# Patient Record
Sex: Female | Born: 1940 | Race: White | Hispanic: No | Marital: Married | Smoking: Never smoker
Health system: Southern US, Community
[De-identification: ages and names within clinical notes are randomized; demographics above are authoritative.]

## PROBLEM LIST (undated history)

## (undated) DIAGNOSIS — E039 Hypothyroidism, unspecified: Secondary | ICD-10-CM

## (undated) DIAGNOSIS — E785 Hyperlipidemia, unspecified: Secondary | ICD-10-CM

## (undated) DIAGNOSIS — N390 Urinary tract infection, site not specified: Secondary | ICD-10-CM

## (undated) DIAGNOSIS — F0390 Unspecified dementia without behavioral disturbance: Secondary | ICD-10-CM

## (undated) HISTORY — PX: ABDOMINAL SURGERY: SHX537

---

## 2001-12-15 ENCOUNTER — Emergency Department (HOSPITAL_COMMUNITY): Admission: EM | Admit: 2001-12-15 | Discharge: 2001-12-15 | Payer: Self-pay | Admitting: Emergency Medicine

## 2001-12-15 ENCOUNTER — Encounter: Payer: Self-pay | Admitting: Emergency Medicine

## 2004-05-04 ENCOUNTER — Inpatient Hospital Stay (HOSPITAL_COMMUNITY): Admission: EM | Admit: 2004-05-04 | Discharge: 2004-05-10 | Payer: Self-pay | Admitting: Emergency Medicine

## 2004-10-28 ENCOUNTER — Emergency Department (HOSPITAL_COMMUNITY): Admission: EM | Admit: 2004-10-28 | Discharge: 2004-10-28 | Payer: Self-pay | Admitting: *Deleted

## 2004-11-21 ENCOUNTER — Ambulatory Visit: Payer: Self-pay | Admitting: Orthopedic Surgery

## 2005-02-19 ENCOUNTER — Emergency Department (HOSPITAL_COMMUNITY): Admission: EM | Admit: 2005-02-19 | Discharge: 2005-02-19 | Payer: Self-pay | Admitting: *Deleted

## 2005-05-14 ENCOUNTER — Emergency Department (HOSPITAL_COMMUNITY): Admission: EM | Admit: 2005-05-14 | Discharge: 2005-05-14 | Payer: Self-pay | Admitting: Emergency Medicine

## 2005-07-13 ENCOUNTER — Emergency Department (HOSPITAL_COMMUNITY): Admission: EM | Admit: 2005-07-13 | Discharge: 2005-07-14 | Payer: Self-pay | Admitting: Emergency Medicine

## 2005-07-20 ENCOUNTER — Ambulatory Visit (HOSPITAL_COMMUNITY): Admission: RE | Admit: 2005-07-20 | Discharge: 2005-07-20 | Payer: Self-pay | Admitting: Orthopaedic Surgery

## 2005-11-26 ENCOUNTER — Emergency Department (HOSPITAL_COMMUNITY): Admission: EM | Admit: 2005-11-26 | Discharge: 2005-11-27 | Payer: Self-pay | Admitting: Emergency Medicine

## 2005-11-29 ENCOUNTER — Inpatient Hospital Stay (HOSPITAL_COMMUNITY): Admission: EM | Admit: 2005-11-29 | Discharge: 2005-12-08 | Payer: Self-pay | Admitting: Emergency Medicine

## 2005-12-02 ENCOUNTER — Ambulatory Visit: Payer: Self-pay | Admitting: Internal Medicine

## 2005-12-13 ENCOUNTER — Ambulatory Visit (HOSPITAL_COMMUNITY): Admission: RE | Admit: 2005-12-13 | Discharge: 2005-12-13 | Payer: Self-pay | Admitting: Family Medicine

## 2005-12-13 ENCOUNTER — Inpatient Hospital Stay (HOSPITAL_COMMUNITY): Admission: AD | Admit: 2005-12-13 | Discharge: 2005-12-26 | Payer: Self-pay | Admitting: Family Medicine

## 2005-12-19 ENCOUNTER — Encounter (INDEPENDENT_AMBULATORY_CARE_PROVIDER_SITE_OTHER): Payer: Self-pay | Admitting: *Deleted

## 2006-03-07 ENCOUNTER — Emergency Department (HOSPITAL_COMMUNITY): Admission: EM | Admit: 2006-03-07 | Discharge: 2006-03-07 | Payer: Self-pay | Admitting: Emergency Medicine

## 2006-12-26 ENCOUNTER — Emergency Department (HOSPITAL_COMMUNITY): Admission: EM | Admit: 2006-12-26 | Discharge: 2006-12-26 | Payer: Self-pay | Admitting: Emergency Medicine

## 2006-12-28 ENCOUNTER — Emergency Department (HOSPITAL_COMMUNITY): Admission: EM | Admit: 2006-12-28 | Discharge: 2006-12-28 | Payer: Self-pay | Admitting: Emergency Medicine

## 2007-01-04 ENCOUNTER — Emergency Department (HOSPITAL_COMMUNITY): Admission: EM | Admit: 2007-01-04 | Discharge: 2007-01-04 | Payer: Self-pay | Admitting: Emergency Medicine

## 2007-01-20 ENCOUNTER — Emergency Department (HOSPITAL_COMMUNITY): Admission: EM | Admit: 2007-01-20 | Discharge: 2007-01-20 | Payer: Self-pay | Admitting: Emergency Medicine

## 2007-02-14 ENCOUNTER — Ambulatory Visit: Payer: Self-pay | Admitting: Gastroenterology

## 2007-03-01 ENCOUNTER — Ambulatory Visit (HOSPITAL_COMMUNITY): Admission: RE | Admit: 2007-03-01 | Discharge: 2007-03-01 | Payer: Self-pay | Admitting: Internal Medicine

## 2009-06-26 ENCOUNTER — Emergency Department (HOSPITAL_COMMUNITY): Admission: EM | Admit: 2009-06-26 | Discharge: 2009-06-26 | Payer: Self-pay | Admitting: Emergency Medicine

## 2009-07-02 ENCOUNTER — Observation Stay (HOSPITAL_COMMUNITY): Admission: RE | Admit: 2009-07-02 | Discharge: 2009-07-04 | Payer: Self-pay | Admitting: Orthopaedic Surgery

## 2009-08-21 ENCOUNTER — Emergency Department (HOSPITAL_COMMUNITY): Admission: EM | Admit: 2009-08-21 | Discharge: 2009-08-21 | Payer: Self-pay | Admitting: Emergency Medicine

## 2010-03-17 ENCOUNTER — Inpatient Hospital Stay (HOSPITAL_COMMUNITY): Admission: EM | Admit: 2010-03-17 | Discharge: 2010-03-19 | Payer: Self-pay | Admitting: Emergency Medicine

## 2010-08-14 ENCOUNTER — Encounter: Payer: Self-pay | Admitting: Family Medicine

## 2010-10-06 LAB — CBC
HCT: 36.3 % (ref 36.0–46.0)
Hemoglobin: 12.1 g/dL (ref 12.0–15.0)
MCV: 90.3 fL (ref 78.0–100.0)
RBC: 4.03 MIL/uL (ref 3.87–5.11)
WBC: 10.5 10*3/uL (ref 4.0–10.5)

## 2010-10-06 LAB — BASIC METABOLIC PANEL
CO2: 21 mEq/L (ref 19–32)
Calcium: 8.2 mg/dL — ABNORMAL LOW (ref 8.4–10.5)
Chloride: 105 mEq/L (ref 96–112)
Chloride: 115 mEq/L — ABNORMAL HIGH (ref 96–112)
Creatinine, Ser: 1.54 mg/dL — ABNORMAL HIGH (ref 0.4–1.2)
GFR calc non Af Amer: 33 mL/min — ABNORMAL LOW (ref >60)
GFR calc non Af Amer: 55 mL/min — ABNORMAL LOW (ref >60)
Glucose, Bld: 95 mg/dL (ref 70–99)
Potassium: 3.8 mEq/L (ref 3.5–5.1)
Potassium: 4.2 mEq/L (ref 3.5–5.1)
Sodium: 134 mEq/L — ABNORMAL LOW (ref 135–145)
Sodium: 134 mEq/L — ABNORMAL LOW (ref 135–145)

## 2010-10-06 LAB — CARDIAC PANEL(CRET KIN+CKTOT+MB+TROPI)
CK, MB: 1.9 ng/mL (ref 0.3–4.0)
CK, MB: 2.8 ng/mL (ref 0.3–4.0)

## 2010-10-06 LAB — DIFFERENTIAL
Eosinophils Relative: 3 % (ref 0–5)
Lymphocytes Relative: 12 % (ref 12–46)
Lymphs Abs: 1.2 10*3/uL (ref 0.7–4.0)
Monocytes Absolute: 0.9 10*3/uL (ref 0.1–1.0)

## 2010-10-06 LAB — URINALYSIS, ROUTINE W REFLEX MICROSCOPIC
Bilirubin Urine: NEGATIVE
Glucose, UA: NEGATIVE mg/dL
Ketones, ur: NEGATIVE mg/dL
pH: 5 (ref 5.0–8.0)

## 2010-10-06 LAB — POCT CARDIAC MARKERS
CKMB, poc: 1.4 ng/mL (ref 1.0–8.0)
Troponin i, poc: <0.05 ng/mL (ref 0.00–0.09)

## 2010-10-06 LAB — GLUCOSE, CAPILLARY: Glucose-Capillary: 113 mg/dL — ABNORMAL HIGH (ref 70–99)

## 2010-10-06 LAB — T4, FREE: Free T4: 1.11 ng/dL (ref 0.80–1.80)

## 2010-10-25 LAB — COMPREHENSIVE METABOLIC PANEL
ALT: 25 U/L (ref 0–35)
Alkaline Phosphatase: 85 U/L (ref 39–117)
BUN: 25 mg/dL — ABNORMAL HIGH (ref 6–23)
CO2: 24 mEq/L (ref 19–32)
Chloride: 107 mEq/L (ref 96–112)
Glucose, Bld: 91 mg/dL (ref 70–99)
Potassium: 3.8 mEq/L (ref 3.5–5.1)
Sodium: 140 mEq/L (ref 135–145)
Total Bilirubin: 0.6 mg/dL (ref 0.3–1.2)

## 2010-10-25 LAB — DIFFERENTIAL
Basophils Relative: 0 % (ref 0–1)
Eosinophils Absolute: 0.2 10*3/uL (ref 0.0–0.7)
Eosinophils Relative: 3 % (ref 0–5)
Lymphs Abs: 1 10*3/uL (ref 0.7–4.0)
Neutrophils Relative %: 72 % (ref 43–77)

## 2010-10-25 LAB — CBC
Hemoglobin: 11.3 g/dL — ABNORMAL LOW (ref 12.0–15.0)
MCHC: 33.6 g/dL (ref 30.0–36.0)
RBC: 3.58 MIL/uL — ABNORMAL LOW (ref 3.87–5.11)
WBC: 6.2 10*3/uL (ref 4.0–10.5)

## 2010-12-06 NOTE — Assessment & Plan Note (Signed)
NAME:  [name], [name]                   CHART#:  [id number]   DATE:  02/14/2007                       DOB:  [birth year]   CHIEF COMPLAINT:  Abdominal pain.   SUBJECTIVE:  [name] is here for an office visit today.  Her husband  came by and made the appointment for complaints of a growth in her  stomach.  She states today she has been having some abdominal pain.  We  last saw her during a hospitalization in May 2007.  She states she has  been having upper abdominal pain intermittent in nature for about 1  year.  She has had no nausea or vomiting or weight loss.  In June, she  had significant diarrhea and was seen in the emergency department and  she states that it is now resolved.  She states her bowel movements are  back to normal.  She denies any melena, rectal bleeding, vaginal  bleeding, dysuria, hematuria.  She states she was told she had a growth  in her stomach when she went to the emergency department on June 29th  for abdominal pain.  She is a very poor historian and does not know many  details of her medical history.  She did not recall that she had her  gallbladder removed or EGD and colonoscopy in May 2007, for instance.   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  The IMA artery opacifies but the origin is  not well seen.  She had a 3 x 3.6-cm calcified mass in the posterior  right hemipelvis, unchanged from May 2007 study.  This was contiguous  with the cranial margin of the sigmoid colon.  Labs at that time  revealed a normal BUN and creatinine.  White count of 6600, hemoglobin  13.4, platelets 342,000.   The patient was seen by our practice at the time she had abnormal  appearing sigmoid colon, although they  did not quote a mass at the  recent CT, which prompted a colonoscopy.  She also had a EGD for  abdominal pain.  On Dec 17, 2005, EGD revealed noncritical distal  esophageal ring, dilated with a 43 and a 56 French Maloney dilator,  small hiatal hernia, antral gastritis with three scars, and deformed but  patent pylorus indicative of prior peptic ulcer disease.  On  colonoscopy, she had a few diverticula at the sigmoid colon but  otherwise normal.  H. pylori serologies were positive and this was  deferred to primary M.D. for treatment.  The patient is not aware of  whether or not she actually was treated, however.   CURRENT MEDICATIONS:  Unknown.  The patient cannot recall, just states  that she is on a pill for her nerves possibly amitriptyline based on  recent ED record.  She also states that she is on Synthroid and a  hypertensive medication, possibly Benicar based on ED report.   ALLERGIES:  No known drug allergies.   PHYSICAL EXAMINATION:  VITAL SIGNS:  Weight 120.5, height 5 feet 4  inches, temp 97.8, blood pressure 180/100,  pulse 80.  GENERAL:  Elderly Caucasian female in no acute distress.  She is  accompanied by her husband.  She is somewhat slow in response and  appears to have difficulty recalling much of her medical history.  HEENT:  Sclerae nonicteric.  Oropharyngeal mucosa moist and pink.  Conjunctivae are pink.  CHEST:  Lungs are clear to auscultation.  CARDIAC:  Reveals a regular rate and rhythm.  A 2/6 systolic ejection  murmur.  ABDOMEN:  Positive bowel sounds.  Abdomen is soft.  Minimal epigastric  tenderness to deep palpation.  No organomegaly or masses.  No rebound  tenderness or guarding.  LOWER EXTREMITIES:  No edema.   IMPRESSION:  The patient is a 70 year old lady with a 1-year history of  upper abdominal pain.  It is intermittent in nature.  She is a very difficult historian and I cannot get an idea of whether  this occurs post prandial.  It is not  associated with any vomiting,  weight loss.  Recent diarrhea is now resolved.  CT reveals a stable  calcified mass in the posterior right hemipelvis.  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  This could explain her abdominal  pain and diarrhea.  At this point, we need to review the CT with the  radiologist.  We will make further recommendations.  In addition, we  will need to verify that her H. pylori has been treated and if we have  difficulty determining that, we will simply order a H. pylori stool  antigen to verify eradication.   PLAN:  1. Review CT.  2. Retrieve records from Dr. Renard [name]' office to determine whether H.      pylori has been treated.    ADDENDUM:  CT reviewed with Dr. Tyron Russell and Dr. Jena Gauss.  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  She has extensive  atherosclerotic disease of the aorta.  Iliac artery appears compromised  as well.  We will schedule her for angiography and possible stenting.  With regards to the Right hemipelvis mass, it is stable.  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  She will need follow-up with  gynecologist.       [name], P.A.  Electronically Signed     [name], M.D.  Electronically Signed    LL/MEDQ  D:  02/15/2007  T:  02/15/2007  Job:  [id number]   cc:   [name] G. Renard Matter, MD

## 2010-12-06 NOTE — Consult Note (Signed)
NAME:  [name], [name]                  ACCOUNT NO.:  [id number]   MEDICAL RECORD NO.:  [id number]          PATIENT TYPE:  AMB   LOCATION:  SDS                          FACILITY:  [facility]   PHYSICIAN:  Art A. Hoss, M.D.      DATE OF BIRTH:  June 21, 1941   DATE OF CONSULTATION:  DATE OF DISCHARGE:  03/01/2007                                 CONSULTATION   REASON FOR CONSULTATION:  Abdominal pain, SMA and celiac stenosis.   HISTORY OF PRESENT ILLNESS:  [name] has a 1-2 year history of  increasing abdominal pain, which is intermittent.  She denies any change  after eating and denies any weight loss.  She has had a number of  emergency room visits for abdominal pain and the etiology is not yet  clear.  She has had an endoscopy, which showed a noncritical distal  esophageal ring.  She also has sigmoid diverticula.  The distal  esophageal ring was dilated to 56 [place].  A CT scan did demonstrate  atherosclerotic disease of her celiac, SMA and IMA.  There is also  suspected to be a lesion in the proximal right common iliac artery.  She  denies any history of claudication.  She describes weakness in both of  her legs and does have great difficulty in ambulation, but this is  symmetrical bilaterally.  She has also had some emergency room visits  for left lower extremity pain.  She denies any nonhealing ulcers of her  toes, but does have a number of sores in her legs that she sees  dermatology for and is treating with hydrocortisone cream.  She denies  any prior surgical interventions for peripheral vascular disease,  coronary vascular disease, or cerebrovascular disease.   PAST MEDICAL HISTORY:  Anxiety disorder and hypertension.   MEDICATIONS:  1. Hydrochlorothiazide.  2. Synthroid.  3. Anxiety medication (she does not know the name of the drug).   ALLERGIES:  No known drug allergies.   SOCIAL HISTORY:  Negative tobacco and negative alcohol.   REVIEW OF SYMPTOMS:  No melena, hematochezia,  or nausea.  No  postprandial pain.  No lower extremity claudication.  No chest pain or  shortness of breath.   PHYSICAL EXAMINATION:  VITAL SIGNS:  She is afebrile.  Blood pressure  today is 195/110.  GENERAL:  In no apparent distress.  HEART:  Regular  rate and rhythm.  LUNGS:  Clear to auscultation.  ABDOMEN:  Soft, no  mass, positive bowel sounds.  NEUROLOGICAL:  Nonfocal.  EXTREMITIES:  There is a significant papular rash involving both legs, predominantly  at her shins.  This is a chronic finding according to her.  No sores or  areas of ulceration of her feet.  No edema or cyanosis.  Pulses in the  right lower extremity are 1+ for the femoral pulse, Dopplerable at the  dorsalis pedis and Dopplerable at the posterior tibial.  In the left  lower extremity, there is a 2+ left femoral pulse, 1+ left dorsalis  pedis pulse, and no pulse at the left posterior tibial artery.  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  She will  undergo mesenteric arteriography today for further evaluation.  Without  a history of postprandial pain or weight loss, I suspect that she does  not have a significant lesion causing her symptomatology.  In addition, there is a right common iliac artery lesion noted by CT.  She has no significant symptomatology related to this at this time that  should prompt treatment.  She does have difficulty with ambulation, but  denies any rest pain in her right leg, claudication in her right leg or  open sores.           ______________________________  Art A. Hoss, M.D.     AAH/MEDQ  D:  03/01/2007  T:  03/02/2007  Job:  [id number]   cc:   Angus G. Renard Matter, MD  R. Roetta Sessions, M.D.

## 2010-12-09 NOTE — Discharge Summary (Signed)
NAME:  [name], [name]                  ACCOUNT NO.:  [id number]   MEDICAL RECORD NO.:  [id number]          PATIENT TYPE:  INP   LOCATION:  A201                          FACILITY:  [facility]   PHYSICIAN:  Angus G. Renard Matter, MD   DATE OF BIRTH:  1941-05-09   DATE OF ADMISSION:  05/04/2004  DATE OF DISCHARGE:  10/18/2005LH                                 DISCHARGE SUMMARY   DIAGNOSES:  1.  Fracture of pubis.  2.  Rhabdomyolysis.  3.  Hyperosmolality.  4.  Hypothyroidism.  5.  Hypokalemia.  6.  Anemia.   CONDITION ON DISCHARGE:  Stable and improved at the time of discharge.   HISTORY OF PRESENT ILLNESS:  This 70 year old fell in her kitchen and was  down for four hours.  She was unable to get up.  The family found her and  brought her to the hospital.  She was noted to have a right superior ramus  fracture, elevated cardiac enzymes, and hypertension on admission.  She had  a slow halting type speech.   LABORATORY DATA:  Admission CBC:  WBC 12,600, hemoglobin 12.2, hematocrit  35.5.  D-dimer 2.54.  Chemistries on admission revealed a sodium of 129,  potassium 3.4, chloride 95, CO2 25, glucose 101, BUN 10, creatinine 0.9,  calcium 9.9.  Subsequent chemistries on May 09, 2004 revealed a sodium  of 126, potassium 3.3, chloride 86, CO2 34, glucose 85, BUN 4, creatinine 1.  Liver enzymes revealed an SGOT of 107, SGPT 47, alkaline phosphatase 103,  bilirubin 0.8, CK on admission 5376, with a CK-MB of 85.5, index 1.6,  troponin 0.32.  Urinalysis negative.   X-rays of the pelvis revealed a minimally displaced fracture of the right  superior pubic ramus.  CT of the brain without contrast negative for  hemorrhage or acute abnormality.  Right foot no acute abnormalities.  CT of  the chest with contrast revealed no pulmonary emboli, lungs clear.  Echocardiogram revealed small-to-moderate pericardial effusion, otherwise  essentially normal echocardiogram.  EKG revealed normal sinus rhythm,  diffuse  nonspecific T wave abnormalities.   HOSPITAL COURSE:  The patient, at the time of her admission, was placed at  bedrest.  She was placed on metoprolol 50 mg b.i.d., clonidine p.r.n. for  blood pressure control.  She was subsequently placed on a regular diet.  She  was given Vicodin p.r.n. for her pain.  The patient was kept at bedrest  initially for several days and was gradually ambulating.   She was seen in consultation by cardiology.  An echocardiogram was obtained.  Her Lopressor doses were decreased to 25 mg b.i.d. Physical therapy was  asked to help with ambulation.  The patient was seen in consultation by  [facility].  She was felt to have rhabdomyolysis due to prolonged  immobility.   The patient was also seen by Dr. Hilda Lias during her hospital stay, and he  felt that she should be seen later in the office as an outpatient.   It was felt that hypothyroidism accounted for many of the features of  presentation.  Her metoprolol was decreased.  The patient was able to be discharged after  six days of hospitalization.  She was ambulating.   DISCHARGE MEDICATIONS:  1.  Synthroid 0.25 mg daily.  2.  K-Dur 20 mEq daily.  3.  Vicodin 5/500 q.4h. p.r.n. for pain.  4.  Altace 10 mg daily.  5.  Hydrochlorothiazide 12.5 mg daily.  6.  Lopressor 25 mg b.i.d.     Angu   AGM/MEDQ  D:  05/24/2004  T:  05/24/2004  Job:  [id number]

## 2010-12-09 NOTE — Group Therapy Note (Signed)
NAME:  [name], [name]                  ACCOUNT NO.:  [id number]   MEDICAL RECORD NO.:  [id number]          PATIENT TYPE:  INP   LOCATION:  A201                          FACILITY:  [facility]   PHYSICIAN:  Catalina Pizza, M.D.        DATE OF BIRTH:  [birth year]   DATE OF PROCEDURE:  12/23/2005  DATE OF DISCHARGE:                                   PROGRESS NOTE   SUBJECTIVE:  Patient is postop day 4 from laparoscopic cholecystectomy.  She  denies any abdominal pain at this time.  She states that her eating is still  not great, but she is taking the Ensure which she is getting routinely.  She  was evaluated and seen by neurologist for her hyperextension of her neck and  her eye blinking which questioned whether related to tardive-type symptoms,  so some of her medicines were discontinued.   OBJECTIVE:  VITAL SIGNS:  Temperature is 98, blood pressure 160/92, pulse  80, respirations 20, 98% on room air.  GENERAL:  Generally, this is a thin white female, older than stated age.  HEENT:  Oropharynx is dry.  LUNGS:  Lungs have a rhonchi at the bases bilaterally.  HEART:  Regular rhythm.  No murmurs appreciated.  ABDOMEN:  Soft, nontender.  No signs of significant erythema around incision  site.  NECK:  Neck does have some generalized stiffness and hyperextension.  EXTREMITIES:  No lower extremity edema.  Patient is moving all extremities,  but does continue to have some clonus.   IMPRESSION:  This is a 70 year old white female who was admitted for  continued abdominal pain and acute renal failure which has resolved with  essentially anorexia.   ASSESSMENT AND PLAN:  1.  Status post cholecystectomy:  She appears to be doing well after this      and abdominal pain is much improved.  Continued to be followed by Dr.      Lovell Sheehan.  2.  Hypertension:  We will start her back on Benicar since her creatinine      has come back to baseline and since the patient is drinking more fluids.      She may need to be  back on her clonidine as well.  3.  Abnormal neck extension and clonus:  Seen and assessed by neurology and      felt that this could be related to tardive-type picture.  She did have      MRI which did not reveal any significant abnormality.  Also, a possible      cause were the Zofran and Darvocet.  4.  Anxiety/depression:  Continue the Lexapro as previously prescribed.  5.  Hypothyroidism:  She continues to have an elevated TSH and her dosage      was increased.  We will need to recheck this in approximately 3-4 weeks      to see if it needs to be titrated up further, and this also may be      contributing to some of her difficulties.  6.  Anorexia:  Trying to encourage the  patient as best I can to eat and      drink.  She is having Ensure, but we will need to continue with      nutrition and patient may be a candidate for having something like      Megace.      Catalina Pizza, M.D.  Electronically Signed     ZH/MEDQ  D:  12/23/2005  T:  12/23/2005  Job:  [id number]

## 2010-12-09 NOTE — Consult Note (Signed)
NAME:  [name], [name]                  ACCOUNT NO.:  [id number]   MEDICAL RECORD NO.:  [id number]          PATIENT TYPE:  INP   LOCATION:  A201                          FACILITY:  [facility]   PHYSICIAN:  Kofi A. Gerilyn Pilgrim, M.D. DATE OF BIRTH:  April 28, 1941   DATE OF CONSULTATION:  12/20/2005  DATE OF DISCHARGE:                                   CONSULTATION   REASON FOR CONSULTATION:  Abnormal posture.   HISTORY OF PRESENT ILLNESS:  This is a 70 year old white female who was  admitted to the hospital for abdominal pain, leukocytosis. She was  previously seen for a weakness and dizziness. The patient has recently  undergone laparoscopic cholecystectomy a few days ago and back on yesterday.  The patient apparently has had abnormal posture and has been hyperextended.  She appears to have abnormal alignment. We have tried to contact the family  members, but we were not able to. History obtained from the nurses indicate  that she did not have this abnormal posture or abnormal eye movements during  the first days of her hospitalization. The patient, herself, reports that  she has had these abnormal movements and neck problems for a long time,  although the nurse's history, admission notes, and other notes argued  against this. The patient does not complain of any headaches, but she does  complain of abdominal pain.   PAST MEDICAL HISTORY:  1.  Pneumonia.  2.  Thickened gallbladder and sigmoid colon, status post cholecystectomy.  3.  History of several falls in the past, status post pelvic fracture a year      ago.  4.  History of peptic ulcer disease requiring surgery 20 years ago.  5.  Gastric surgery.   ADMISSION MEDICATIONS:  Hydrochlorothiazide, Levaquin, Benicar, Percocet,  Ativan, Lexapro, thyroid.   CURRENT MEDICATIONS:  Protonix, piperacillin, Zofran, Darvocet.   SOCIAL HISTORY:  She smokes a pack of cigarettes per day. No alcohol use.  She is married. She has two children.   REVIEW  OF SYSTEMS:  See the history of present illness. No headaches. Again,  no fever or chills. She does complain of abdominal pain. No focal numbness  or weakness.   PHYSICAL EXAMINATION:  VITAL SIGNS: Temperature 98.0, pulse 94, respirations  20.  HEENT: The patient has continuous eye blinking activity with some movements  of the mouth. Her neck is hyperextended. Facial muscle strength as  mentioned, but again there is continuous squinting movement of the eyes and  some oral movement. Tongue is midline. Uvula is midline.  MENTATION: The patient responds to name and is oriented times person and  place. She is not oriented to time. She follows commands. She does speak and  is coherent, although she does appear to have limited and intact.  NEUROLOGIC: Motor examination shows antigravity strength in all four  extremities. She is a little weak in the legs, at least 4 in the legs and 5  in the upper extremities. Reflexes are preserved.  Plantar reflexes are  downgoing. Coordination does not reveal any tremor.    HeaddCT Remote left occipital  infarct. There is frontal and maxillary sinus  disease noted.   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  There are no clear culprits at this time, although one wonders if the abrupt  discontinuation of her Lexapro could contribute to this. Other potential  culprits to consider include antibiotics, Zofran and Darvocet.   PLAN:  1.  I would avoid all neuroleptics including Reglan.  2.  Repeat neurologic evaluation.      Kofi A. Gerilyn Pilgrim, M.D.  Electronically Signed     KAD/MEDQ  D:  12/21/2005  T:  12/21/2005  Job:  [id number]

## 2010-12-09 NOTE — Group Therapy Note (Signed)
NAME:  [name], [name]                  ACCOUNT NO.:  [id number]   MEDICAL RECORD NO.:  [id number]          PATIENT TYPE:  INP   LOCATION:  A313                          FACILITY:  [facility]   PHYSICIAN:  Angus G. Renard Matter, MD   DATE OF BIRTH:  05-03-1941   DATE OF PROCEDURE:  12/02/2005  DATE OF DISCHARGE:                                   PROGRESS NOTE   This patient had recent abdominal pain. She had previously had gallbladder  wall thickness and gallstones with a small amount of pericholecystic fluid.  There was some concern about possible thickness of sigmoid. She also has  right lower lobe airspace disease consistent with pneumonia. There is some  question of central necrosis in the lung.   OBJECTIVE:  VITAL SIGNS: Blood pressure 145/83, respirations 20, pulse 74,  temperature 97.  GENERAL: There is no generalized weakness.  HEART: Regular rhythm.  LUNGS: Diminished breath sounds.  ABDOMEN: Slightly tender mid abdomen.   ASSESSMENT:  The patient has right lower lobe airspace disease consistent  with pneumonia, possible cavitation, central necrosis. The patient does have  also known gallstones and questionable pathology in sigmoid colon. Plan to  continue current IV antibiotics. Will obtain pulmonary consult.      Angus G. Renard Matter, MD  Electronically Signed     AGM/MEDQ  D:  12/02/2005  T:  12/03/2005  Job:  [id number]

## 2010-12-09 NOTE — Group Therapy Note (Signed)
NAME:  [name], [name]                  ACCOUNT NO.:  [id number]   MEDICAL RECORD NO.:  [id number]          PATIENT TYPE:  INP   LOCATION:  A313                          FACILITY:  [facility]   PHYSICIAN:  Edward L. Juanetta Gosling, M.D.DATE OF BIRTH:  29-Apr-1941   DATE OF PROCEDURE:  DATE OF DISCHARGE:                                   PROGRESS NOTE   Patient of Dr. Renard Matter.   SUBJECTIVE:  [name] overall seems about the same.  She says she is still  having a lot of abdominal discomfort.   OBJECTIVE:  Her physical examination this morning shows that her temperature  is 97, pulse 66, respirations 20, blood pressure 159/81, O2 sats 99% on room  air.  Her chest is clearer than previously and she looks a bit more  comfortable.   ASSESSMENT:  She is better.   PLAN:  I think we would go ahead and get another chest x-ray today.  Continue with her antibiotics.  She needs to be pretty clear as far as her  exam at least before we can discuss having her surgery done.      Edward L. Juanetta Gosling, M.D.  Electronically Signed     ELH/MEDQ  D:  12/04/2005  T:  12/04/2005  Job:  [id number]

## 2010-12-09 NOTE — Group Therapy Note (Signed)
NAME:  [name], [name]                  ACCOUNT NO.:  [id number]   MEDICAL RECORD NO.:  [id number]          PATIENT TYPE:  INP   LOCATION:  A201                          FACILITY:  [facility]   PHYSICIAN:  Angus G. Renard Matter, M.D. DATE OF BIRTH:  01-21-1941   DATE OF PROCEDURE:  DATE OF DISCHARGE:                                   PROGRESS NOTE   SUBJECTIVE:  This patient was admitted following a fall and was noted to  have right superior ramus fracture.  Did have elevated cardiac enzymes and  hypertension.   LABORATORY DATA:  CPK-MB was 68.2, troponin was 0.10, myoglobin 500  ___________.   PHYSICAL EXAMINATION:  VITAL SIGNS:  Blood pressure 127/81, respirations 20,  pulse 64, temperature 97.4.  LUNGS:  Clear to percussion and auscultation.  HEART:  Regular rhythm.  EXTREMITIES:  The patient has tenderness over the right hip.   ASSESSMENT:  The patient admitted with fractured pubic ramus, essential  hypertension, elevated cardiac enzymes.   PLAN:  Continue regimen.  Will obtain orthopedic and cardiac consult.      AGM/MEDQ  D:  05/05/2004  T:  05/05/2004  Job:  [id number]

## 2010-12-09 NOTE — Group Therapy Note (Signed)
NAME:  [name], [name]                  ACCOUNT NO.:  [id number]   MEDICAL RECORD NO.:  [id number]          PATIENT TYPE:  INP   LOCATION:  A201                          FACILITY:  [facility]   PHYSICIAN:  Angus G. Renard Matter, MD   DATE OF BIRTH:  05-Jun-1941   DATE OF PROCEDURE:  12/22/2005  DATE OF DISCHARGE:                                   PROGRESS NOTE   This patient is 3 days postoperative following laparoscopic cholecystectomy.  Her condition has improved.  She has less abdominal pain.  She has had some  hyperextension of her neck and eye blinking, and was seen by a neurologist,  and he felt this could be related to medications that she had been taking.  Some of these have been discontinued.   OBJECTIVE:  VITAL SIGNS:  Blood pressure 134/84, respirations 24, pulse 97.  HEART:  Regular rhythm.  LUNGS:  Occasional rhonchus heard over the lung fields.  ABDOMEN:  The patient has tenderness around the sites of incision.  NECK:  The patient has stiffness and hyperextension of the neck.   ASSESSMENT:  The patient is improving insofar as her postoperative status is  concerned.  She does have slightly elevated TSH, and will address this issue  with increased dosage of Synthroid.  Continue physical therapy.      Angus G. Renard Matter, MD  Electronically Signed     AGM/MEDQ  D:  12/22/2005  T:  12/22/2005  Job:  [id number]

## 2010-12-09 NOTE — Group Therapy Note (Signed)
NAME:  [name], [name]                  ACCOUNT NO.:  [id number]   MEDICAL RECORD NO.:  [id number]          PATIENT TYPE:  INP   LOCATION:  A313                          FACILITY:  [facility]   PHYSICIAN:  Edward L. Juanetta Gosling, M.D.DATE OF BIRTH:  [birth year]   DATE OF PROCEDURE:  12/03/2005  DATE OF DISCHARGE:                                   PROGRESS NOTE   Patient of Dr. Althea Grimmer.  [name] seems about the same.  She is  continuing to cough some.  She still says her abdomen hurts.  We are  awaiting for her pneumonia to clear before she is going to be able to have  her cholecystectomy.  It is slightly better clinically.  Was actually worse  by x-ray criteria.      Edward L. Juanetta Gosling, M.D.  Electronically Signed     ELH/MEDQ  D:  12/03/2005  T:  12/04/2005  Job:  [id number]

## 2010-12-09 NOTE — Group Therapy Note (Signed)
NAME:  [name], [name]                  ACCOUNT NO.:  [id number]   MEDICAL RECORD NO.:  [id number]          PATIENT TYPE:  INP   LOCATION:  A201                          FACILITY:  [facility]   PHYSICIAN:  Angus G. Renard Matter, M.D. DATE OF BIRTH:  04/28/1941   DATE OF PROCEDURE:  DATE OF DISCHARGE:                                   PROGRESS NOTE   SUBJECTIVE:  This patient was admitted to the hospital with fracture pubic  ramus and abnormal cardiac enzymes.  Her condition remains stable.   OBJECTIVE:  Vital signs:  Blood pressure 134/75, respirations 18, pulse 50,  temperature 97.8.  Lungs are clear to P&A.  Occasional rhonchus heard over  the lower lung fields.  Heart, regular rhythm.   The patient was admitted following a fall, and does have a fracture of the  right pubic ramus.  She does have abnormal cardiac enzymes panel with CPK of  3559, CPK-MB 33.3, troponin of 0.13.   ASSESSMENT:  The patient was admitted with fractured pubic ramus.  She does  have elevated CPK thought secondary to injury.   PLAN:  Continue her current regimen.  We will gradually ambulate the patient  as pain subsides.      AGM/MEDQ  D:  05/06/2004  T:  05/06/2004  Job:  [id number]

## 2010-12-09 NOTE — Group Therapy Note (Signed)
NAME:  [name], [name]                  ACCOUNT NO.:  [id number]   MEDICAL RECORD NO.:  [id number]          PATIENT TYPE:  INP   LOCATION:  A201                          FACILITY:  [facility]   PHYSICIAN:  Angus G. Renard Matter, MD        DATE OF BIRTH:   DATE OF PROCEDURE:  12/19/2005  DATE OF DISCHARGE:                                   PROGRESS NOTE   This patient has history of bronchopneumonia and cholelithiasis.  The  pneumonia has improved, the dehydration and prerenal azotemia have improved.  She is to have surgery today, laparoscopic cholecystectomy by Dr. Lovell Sheehan.   OBJECTIVE:  VITAL SIGNS:  Blood pressure 157/92, respirations 20, pulse 84,  temperature 97.3.  HEART:  Regular rhythm.  LUNGS:  Diminished breath  sounds.  ABDOMEN:  Slight tenderness in the mid and upper abdomen.   ASSESSMENT:  The patient does have cholelithiasis, history of recent  pneumonia, azotemia, and dehydration.   PLAN:  To proceed with laparoscopic cholecystectomy today.      Angus G. Renard Matter, MD  Electronically Signed     AGM/MEDQ  D:  12/19/2005  T:  12/19/2005  Job:  [id number]

## 2010-12-09 NOTE — Group Therapy Note (Signed)
NAME:  [name], [name]                  ACCOUNT NO.:  [id number]   MEDICAL RECORD NO.:  [id number]          PATIENT TYPE:  INP   LOCATION:  A201                          FACILITY:  [facility]   PHYSICIAN:  Angus G. Renard Matter, MD   DATE OF BIRTH:  [birth year]   DATE OF PROCEDURE:  12/24/2005  DATE OF DISCHARGE:                                   PROGRESS NOTE   This patient continues to improve following a cholecystectomy.  She still  has some hyperextension of her neck and eye blinking, but this seems to be  improved.   OBJECTIVE:  VITAL SIGNS:  Blood pressure 174/86, respirations 20, pulse 77,  temperature 97.9.  Her CBC and electrolytes have improved.  HEART:  Regular rhythm.  LUNGS:  Diminished breath sounds.  ABDOMEN:  The patient has tenderness and soreness around the incisions.   ASSESSMENT:  The patient is improving following cholecystectomy.  She did  have dehydration with azotemia and hypothyroidism.  Plan to continue current  regimen.      Angus G. Renard Matter, MD  Electronically Signed     AGM/MEDQ  D:  12/24/2005  T:  12/25/2005  Job:  [id number]

## 2010-12-09 NOTE — H&P (Signed)
NAME:  [name], [name]                  ACCOUNT NO.:  [id number]   MEDICAL RECORD NO.:  [id number]          PATIENT TYPE:  INP   LOCATION:  A201                          FACILITY:  [facility]   PHYSICIAN:  Catalina Pizza, M.D.        DATE OF BIRTH:  30-Dec-1940   DATE OF ADMISSION:  12/13/2005  DATE OF DISCHARGE:  LH                                HISTORY & PHYSICAL   PRIMARY MEDICAL DOCTOR:  Dr. Butch Penny   HISTORY OF PRESENT ILLNESS:  [name] is a 70 year old white female who was  recently admitted to the hospital on Nov 29, 2005 and discharged on Dec 08, 2005.  She was there initially for dizziness and weakness and then had  subsequent evaluation for abdominal pain.  Abnormalities on CT scan did show  thickening of the gallbladder and sigmoid colon.  GI was involved with this  and felt that likely needed to have her gallbladder removed but in the  meantime, she was also found to have a bronchopneumonia and was started on  IV antibiotics due to hypoxia.  She was discharged on Dec 08, 2005 following  full workup and sent out on oral antibiotics.  When at home she still  continued to have generalized weakness requiring being in a wheelchair and  full assistance with getting around.  She continued to have abdominal pain  throughout, no significant nausea and vomiting, no significant diarrhea or  constipation.  The patient presents to the office today with her husband who  states that she has continued to have poor appetite and poor p.o. intake of  fluids.  She denies any specific chest pains or shortness of breath.  Given  her continued weakness and her very sick appearance, she had stat labs sent  for CBC which revealed a white count of 11.6 which is up from 7.2 last taken  on Dec 08, 2005 and a hemoglobin of 11.9 down from 12.9 previously.  She did  have significant left shift with 84% granulocytes and 9.7 on absolute  neutrophil count.  Her BMET showed a sodium of 129, potassium of 4.8,  chloride of 92, CO2 of 23, glucose of 109, a BUN of 84 and a creatinine of  6.2.  She also had a chest x-ray attained, too, which showed minimal  residual right basilar atelectasis but did not show the significant  pneumonia as on previous scan done on Dec 05, 2005.   PAST MEDICAL HISTORY:  The patient has hypothyroidism, hypertension, anxiety  and depression and known gallbladder disease as mentioned above.  Peptic  ulcer disease requiring surgery approximately 20 years ago.  Several falls  with fracture of the pelvis approximately 1 year ago.   MEDICATIONS:  She was discharged on 2.5% hydrocortisone cream with Sarna  lotion applied b.i.d., Synthroid 0.75 mcg p.o. daily, hydrochlorothiazide 25  mg p.o. daily, Lexapro 10 mg p.o. daily, Catapres 0.1 mg b.i.d., Levaquin  750 mg p.o. daily x7 days, Percocet 5/325 one q.4 h. p.r.n. pain, Benicar 20  mg p.o. daily, Ativan 0.5 mg q.4 h.  p.r.n.   ALLERGIES:  NO KNOWN DRUG ALLERGIES.   SOCIAL HISTORY:  She does smoke about a pack of cigarettes a day.  She does  not use any alcohol.  She is married.  Has two children.   REVIEW OF SYSTEMS:  Per HPI.  Negative for fever or chills but continued to  have weakness.  No further problems with feeling shortness of breath as she  did previously and has not had any significant cough since her  hospitalization.   PHYSICAL EXAMINATION:  VITAL SIGNS:  Temperature is 97.7, blood pressure  130/71, pulse is 68, respirations 18, she weighs approximately 90.1 pounds.  GENERAL:  This is a very thin-appearing, sick-appearing, pale, older-than-  stated-age white female in wheelchair.  HEENT:  Pupils equal, round and reactive to light and accommodation.  Oropharynx shows very dry tongue but posterior pharynx was moist.  Bad  dentition throughout, some cracking at her lips.  Neck is supple.  No JVD,  no thyromegaly, no lymphadenopathy.  HEART:  Regular rhythm with no murmurs, gallops, or rubs.  LUNGS:  Clear to  auscultation bilaterally.  I do not appreciate any crackles  or rhonchi.  ABDOMEN:  Soft but suprapubic type tenderness to palpation, somewhat diffuse  throughout.  EXTREMITIES:  Very small atrophy of the legs.  No edema.  Skin does have an  erythematous serpiginous type rash on her lower extremities previously seen  by Dr. Margo Aye, unknown cause for this.  NEUROLOGIC:  The patient is alert and oriented to person and place, not  exact on time, she does follow commands routinely.   LABORATORY WORK AND X-RAYS:  As above.   IMPRESSION:  This is a 70 year old very sick-appearing white female with  apparent acute renal failure and leukocytosis of unknown origin.   ASSESSMENT AND PLAN:  Problem 1. ACUTE RENAL FAILURE.  We will consult Dr.  Kristian Covey for this.  We will give IV fluids at 250 mL/hr for 4 hours then  decrease to 125 mL/hr given her size.  We will also check a urine sodium and  urine creatinine as well as UA and ultrasound of the kidneys looking for  specifically any signs of blockage that could be causing acute renal  failure.   Problem 2. LEUKOCYTOSIS.  The patient is coming off her Levaquin but given  her continued abdominal pain and the unknown issues with her thickened  gallbladder and thickened colon we will cover with Zosyn and check liver  profile to see if there is any changes in her liver enzymes or her  bilirubin.  We will recheck a CBC in the morning.   Problem 3. HYPOTHYROIDISM.  We will check a TSH and Free T4.  Did not see  results checked during last hospitalization to see if any adjustments need  to be made to her medicines.   Problem 4. HISTORY OF PNEUMONIA.  As mentioned above, checked chest x-ray  and was not having any current symptoms but previous symptom of hypoxia.  We  will check ABG both from a renal standpoint to see if she has any signs of  acidosis.  DISPOSITION:  Discussed this with her husband concerns of how ill she is.  We will hold all of her  blood pressure medicines at this time and admit to  telemetry and will be followed up on by Dr. Renard Matter.      Catalina Pizza, M.D.  Electronically Signed     ZH/MEDQ  D:  12/13/2005  T:  12/13/2005  Job:  [id number]

## 2010-12-09 NOTE — Group Therapy Note (Signed)
NAME:  [name], [name]                  ACCOUNT NO.:  [id number]   MEDICAL RECORD NO.:  [id number]          PATIENT TYPE:  INP   LOCATION:  A201                          FACILITY:  [facility]   PHYSICIAN:  Angus G. Renard Matter, MD   DATE OF BIRTH:  [birth year]   DATE OF PROCEDURE:  12/25/2005  DATE OF DISCHARGE:                                   PROGRESS NOTE   This patient had laparoscopic cholecystectomy in the latter part of the  week.  She has less abdominal pain.  She does seem to be improved with  reference to the muscle contraction of the back of her neck.  Her  chemistries and BMET are relatively normal.  Her appetite is diminished.  According to the nursing staff, she is not eating very much.   OBJECTIVE:  VITAL SIGNS:  Blood pressure 178/95, respirations 20, pulse 94,  temp 98.5.  LUNGS:  Diminished breath sounds.  HEART:  Regular rhythm.  ABDOMEN:  The patient has some soreness and tenderness around the sites of  incision in abdomen.  NEUROLOGIC:  She does still have some slight stiffness of her neck and  blinking of her eyes.   OVERALL ASSESSMENT:  The patient has improved with reference to her previous  surgery and previous medical problems but still needs nursing assistance and  attention to such needs as nutrition.      Angus G. Renard Matter, MD  Electronically Signed     AGM/MEDQ  D:  12/25/2005  T:  12/25/2005  Job:  [id number]

## 2010-12-09 NOTE — H&P (Signed)
NAME:  [name], [name]                  ACCOUNT NO.:  [id number]   MEDICAL RECORD NO.:  [id number]          PATIENT TYPE:  EMS   LOCATION:  ED                            FACILITY:  [facility]   PHYSICIAN:  Mila Homer. Sudie Bailey, M.D.DATE OF BIRTH:  1941/05/23   DATE OF ADMISSION:  05/04/2004  DATE OF DISCHARGE:  LH                                HISTORY & PHYSICAL   ADDENDUM:  The patient actually has been taken care of by Dr. Renard Matter for  some time and has not been seen by another doctor.  This was misinformation  from the emergency room.      SDK/MEDQ  D:  05/04/2004  T:  05/04/2004  Job:  [id number]

## 2010-12-09 NOTE — Group Therapy Note (Signed)
NAME:  [name], [name]                  ACCOUNT NO.:  [id number]   MEDICAL RECORD NO.:  [id number]          PATIENT TYPE:  INP   LOCATION:  A201                          FACILITY:  [facility]   PHYSICIAN:  Angus G. Renard Matter, MD   DATE OF BIRTH:  01-16-1941   DATE OF PROCEDURE:  12/15/2005  DATE OF DISCHARGE:                                   PROGRESS NOTE   This patient has a history of bronchopneumonia and cholelithiasis.  She had  been previously hospitalized with this problem and had been at home with  home nursing care attempting to get her completely over her pneumonia so  that she would tolerate the cholecystectomy.  She was found to be in renal  failure with high creatinine of 6.2 with BUN 84.  She had been taking very  little fluids at home and had become dehydrated.  Her lab results do show  improvement.  Her current BMET shows a sodium 131, potassium 4, chloride  103, CO2 22, glucose 90, BUN 38, creatinine 2.2.  Patient still has  intermittent abdominal pain.   OBJECTIVE:  VITAL SIGNS:  Blood pressure 149/82, respirations 20, pulse 70,  temperature 97.  LUNGS:  Diminished breath sounds.  CARDIOVASCULAR:  Regular rhythm.  ABDOMEN:  Slight tenderness in upper abdomen.   ASSESSMENT:  Patient was readmitted to the hospital in acute renal failure  with dehydration and history of bronchopneumonia, cholelithiasis.  Patient  has improved.   PLAN:  Continue IV fluids, continue IV Zosyn, continue to monitor thyroid  and adjust dosage appropriately as needed.      Angus G. Renard Matter, MD  Electronically Signed     AGM/MEDQ  D:  12/15/2005  T:  12/15/2005  Job:  [id number]

## 2010-12-09 NOTE — Procedures (Signed)
NAME:  [name], [name]                  ACCOUNT NO.:  [id number]   MEDICAL RECORD NO.:  [id number]          PATIENT TYPE:  INP   LOCATION:  A201                          FACILITY:  [facility]   PHYSICIAN:  [place] Bing, M.D.  DATE OF BIRTH:  03/29/1941   DATE OF PROCEDURE:  05/09/2004  DATE OF DISCHARGE:                                  ECHOCARDIOGRAM   REFERRED BY:  1.  Angus G. Renard Matter, M.D.  2.  [place] Bing, M.D.   CLINICAL DATA:  A 70 year old woman with syncope.   M-MODE:  Aorta 3.2, left atrium 3.1, septum 1.6, posterior wall 1.3, LV  diastole 3.5, LV systole 2.3.   RESULTS:  1.  Technically adequate echocardiographic study.  2.  Left atrial size at the upper limit of normal to mildly increased;      normal right atrium.  3.  The right ventricular cavity is small;  mild to moderate hypertrophy      present.  Right ventricular systolic function is normal.  4.  Normal aortic valve;  mild annular and aortic root calcification.  5.  Normal mitral valve;  mild to moderate annular calcification.  6.  Normal tricuspid valve.  7.  Pulmonic valve not well seen, probably normal.  8.  Proximal pulmonary artery is normal.  9.  Left ventricular cavity is decreased in size;  mild to moderate      hypertrophy;  normal regional and global left ventricular systolic      function.  10. Normal inferior vena cava.  11. Small to moderate pericardial effusion, almost entirely located anterior      to the heart.  No evidence for tamponade.     [place]   RR/MEDQ  D:  05/09/2004  T:  05/09/2004  Job:  [id number]

## 2010-12-09 NOTE — Discharge Summary (Signed)
NAME:  [name], [name]                  ACCOUNT NO.:  [id number]   MEDICAL RECORD NO.:  [id number]          PATIENT TYPE:  INP   LOCATION:  A201                          FACILITY:  [facility]   PHYSICIAN:  Angus G. Renard Matter, MD   DATE OF BIRTH:  June 25, 1941   DATE OF ADMISSION:  12/13/2005  DATE OF DISCHARGE:  06/05/2007LH                                 DISCHARGE SUMMARY   A 70 year old white female admitted on Dec 13, 2005 and discharged December 26, 2005, 13 days hospitalization.   DIAGNOSES:  1.  Chololithiasis.  2.  Acute renal failure.  3.  Leukocytosis.  4.  Hypothyroidism.  5.  History of pneumonia.  6.  Malnutrition.  7.  Hyponatremia.  8.  Tardive dyskinesia.  9.  Anxiety and depression.   SURGICAL PROCEDURE:  Cholecystectomy, laparoscopic.   This patient was admitted at the hospital in May of 2007 and discharged May  18.  She was initially treated for weakness and abdominal pain.  Abnormalities on the CT did show thickening of gallbladder and sigmoid  colon.  GI was involved with this and it was felt that she would need to  have her gallbladder removed.  She did develop pneumonia and was started on  IV antibiotics.  She did have hypoxia, and she was discharged on May 18  following a full workup and sent out on oral antibiotics.  Then, at home,  she continued to have generalized weakness requiring wheelchair and full  assistance from family at home.  She continued to have abdominal pain and  nausea.  She was seen in the office with a poor appetite.  She was extremely  weak at the time of her admission.  A CBC revealed a white count of 11, 600,  a BNP showed a sodium of 129, potassium 4.8, chloride 92, CO2 23, glucose  109, BUN 84 and creatinine was 6.2.  X-ray was obtained, which showed  residual white basilar atelectasis, but did not show a significant ammonia.   EXAMINATION:  VITAL SIGNS:  An acute and chronically ill appearing white  female with a blood pressure 130/71, pulse  68 and respirations 18.  GENERAL:  A thin-appearing sick white female.  EYES:  Pupils round, regular and equal.  NECK:  Supple.  HEART:  Regular rhythm.  No murmurs.  LUNGS:  Clear to P&A.  ABDOMEN:  Soft, but tenderness in the epigastrium.  EXTREMITIES:  Showed atrophy of legs.  SKIN:  She does have a rash on the lower extremities.  NEUROLOGIC:  The patient is alert and oriented.   LABORATORY DATA:  Admission CBC:  WBC 8900 with hemoglobin 10.5 and  hematocrit 30.5.  Subsequent CBC on May 26:  WBC 9300 with hemoglobin 10.5  and hematocrit 31.5.  Blood gases on admission:  A pH of 7.425 with a pCO2  of 32.8 and a pO2 of 76.6.  Chemistries on admission:  Sodium 133, potassium  4.5, chloride 100, CO2 24, glucose 102, BUN 71 and creatinine 4.5, calcium  8.7 and total protein was 5.8.  Subsequent chemistries on  Dec 21, 2005:  Sodium 135, potassium 4.2, chloride 103, CO2 27, glucose 99, BUN 2 and  creatinine 0.9.  On December 24, 2005:  Sodium 133, potassium 3.8, chloride 101,  CO2 26, glucose 92, BUN less than 1 and creatinine 1.  TSH on May 26 was  7.895 and previous TSH on May 23 was 27.519.  Serum iron 54.  Folate 5.3.  Blood culture:  No growth.  Electrocardiogram:  Sinus rhythm.   HOSPITAL COURSE:  The patient, at the time of her admission was placed on  intravenous fluids with normal saline at 250 mL per hour.  Additionally,  then 125 mL per hour.  She was placed on Zosyn, renal dose and Tylenol 650  mg.  The patient's Zosyn dosage was changed according to pharmaceutical  protocol.  She was given morphine for pain.  Her thyroid dosage, Synthroid,  was changed to 0.075 mg daily.  Hydrocortisone 2.5% was applied to her leg.  The patient was placed on a pureed renal diet.  She was seen in consultation  by Dr. Lovell Sheehan and GI service as well.  Dr. Lovell Sheehan planned laparoscopic  cholecystectomy following stabilization of patient from a medical point of  view.  The patient did have to have runs  of KCl for hypokalemia.  On May 29,  the patient was taken to surgery and had laparoscopic cholecystectomy.  Following this, her condition did improve.  She had less abdominal pain, but  continue to have neurological type symptoms with stiffening of her neck and  linking of her eyes.  She was seen in consultation by Dr. Gerilyn Pilgrim,  neurologists, and placed on Benadryl 25 mg every 6 hours and subsequently  Artone 2 mg t.i.d.  The patient showed a slow, but progressive improvement.  She had to be treated with Megace.  Her appetite was very poor even towards  the latter part of the hospitalization.  Zosyn, Zofran and Darvocet were  discontinued on May 31, and there was some question of whether or not  medications may have played a role in her symptoms, of which the suggested  tardive dyskinesia.  The patient finally was able to be transferred to a  nursing facility.   She was to continue:  1.  Synthroid 75 mcg daily.  2.  Benicar 20 mg daily.  3.  Hydrocortisone 2.5% with Sarna b.i.d. to legs.  4.  Clonidine 0.1 mg b.i.d.  5.  Hydrochlorothiazide 25 mg daily.  6.  Levaquin 750 mg daily.  7.  Lexapro 10 mg daily.  8.  Ativan 0.5 mg daily.  9.  Protonix 40 mg daily.  10. Benadryl 25 mg q.6h.  11. Megace 800 mg daily.  12. Vitamin supplements, Ensure or Boost b.i.d.   Physical therapy consult.      Angus G. Renard Matter, MD  Electronically Signed    AGM/MEDQ  D:  12/26/2005  T:  12/26/2005  Job:  [id number]

## 2010-12-09 NOTE — Consult Note (Signed)
NAME:  [name], [name]                  ACCOUNT NO.:  [id number]   MEDICAL RECORD NO.:  [id number]          PATIENT TYPE:  INP   LOCATION:  A313                          FACILITY:  [facility]   PHYSICIAN:  Edward L. Juanetta Gosling, M.D.DATE OF BIRTH:  [birth year]   DATE OF CONSULTATION:  12/02/2005  DATE OF DISCHARGE:                                   CONSULTATION   REASON FOR CONSULTATION:  Abdominal discomfort.  She has had weakness, has  been being worked up for gallbladder disease, and she had fallen at home.  She says that she has abdominal pain that is giving her a lot of trouble.  She says that the pain is in the left part of her abdomen.  She says she has  not had any cough, no congestion.  No fever or chills.  She says that it is  her stomach that is giving her trouble.  She has had a previous apparent  stroke.   MEDICATIONS AT HOME:  1.  Synthroid.  2.  Hydrochlorothiazide.  3.  Tylenol.   MEDICATIONS HERE:  1.  Zithromax.  2.  Rocephin.  3.  Lovenox.  4.  Still on HCTZ.  5.  Still on the thyroxin.  6.  A number of p.r.n. medications.   ALLERGIES:  She is not allergic to any medications.   PAST MEDICAL HISTORY:  1.  Positive for hypothyroidism.  2.  Peptic ulcer disease.  She has some sort of surgery about 20 years ago.  3.  She has had several falls and had a fractured pelvis about a year ago.   SOCIAL HISTORY:  She does smoke about a package of cigarettes daily and has  for about 40 years.  She does not use any alcohol.  She is married.   REVIEW OF SYSTEMS:  Otherwise, pretty much negative.   PHYSICAL EXAMINATION:  GENERAL:  A well-developed, well-nourished female who  has a mild speech impediment.  VITAL SIGNS:  Her temperature is 97, pulse 70, respirations 20, blood sugar  101, blood pressure 156/97, O2 saturations 97% on 2 liters.  Height listed  at 65 inches, weight at 96.3.  HEENT:  Her pupils are reactive.  Nose and throat are clear.  Her dentition  is in poor  repair.  NECK:  Supple.  She does not have any bruits.  CHEST:  Rhonchi bilaterally.  No wheezes.  HEART:  Regular without gallop.  ABDOMEN:  Soft without masses.  She does complain of abdominal pain  diffusely.  EXTREMITIES:  No edema.   LABORATORY DATA:  pO2 of 76, pCO2 of 31, pH 7.38.  White count 6400,  hemoglobin 11.6, platelets 232, 81% neutrophils.  Electrolytes done  yesterday showed sodium of 131.  Others are normal.   Chest x-ray shows right lower lobe pneumonia.  This is read as showing some  progression, but she has not actually had a chest x-ray that is in the  system at least.  I think it is likely that this may represent aspiration.  Certainly, it is in the right place.  I think  the Rocephin and Zithromax are  pretty good coverage, so I would continue those.  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   Thanks for allowing me to see her with you.      Edward L. Juanetta Gosling, M.D.  Electronically Signed     ELH/MEDQ  D:  12/02/2005  T:  12/04/2005  Job:  [id number]

## 2010-12-09 NOTE — Group Therapy Note (Signed)
NAME:  [name], [name]                  ACCOUNT NO.:  [id number]   MEDICAL RECORD NO.:  [id number]          PATIENT TYPE:  INP   LOCATION:                                FACILITY:  [facility]   PHYSICIAN:  Angus G. Renard Matter, M.D. DATE OF BIRTH:  02-23-1941   DATE OF PROCEDURE:  DATE OF DISCHARGE:                                   PROGRESS NOTE   This patient was admitted to the hospital with fractured pubic ramus and  abnormal cardiac enzymes.  She ambulated in the hall yesterday and is  feeling better.   OBJECTIVE:  VITAL SIGNS:  Blood pressure 175/87, respirations 20, pulse 59,  temperature 96.8.  LUNGS:  Clear to P&A.   ASSESSMENT:  Patient admitted with fractured pubic ramus, abnormal cardiac  enzymes, rhabdomyolysis.  Patient is being evaluated for possible  hypothyroidism.   PLAN:  Continue current regimen.  Await laboratory studies.  Patient  possibly could be discharged later today.       ___________________________________________  Ishmael Holter. Renard Matter, M.D.    AGM/MEDQ  D:  05/10/2004  T:  05/10/2004  Job:  [id number]

## 2010-12-09 NOTE — Discharge Summary (Signed)
NAME:  [name], [name]                  ACCOUNT NO.:  [id number]   MEDICAL RECORD NO.:  [id number]          PATIENT TYPE:  INP   LOCATION:  A313                          FACILITY:  [facility]   PHYSICIAN:  Angus G. Renard Matter, MD   DATE OF BIRTH:  March 15, 1941   DATE OF ADMISSION:  11/29/2005  DATE OF DISCHARGE:  05/18/2007LH                                 DISCHARGE SUMMARY   PATIENT IDENTIFICATION:  A 70 year old white female admitted Nov 29, 2005,  discharged Dec 08, 2005 with a 9-day hospitalization.   DIAGNOSES:  1.  Bronchial pneumonia.  2.  Hypoxemia.  3.  Cholelithiasis.  4.  Hypothyroidism.  5.  Hypertension.  6.  History of previous fall.  7.  Weakness.  8.  History of peptic ulcer disease.   CONDITION ON DISCHARGE:  The patient's condition is stable at the time of  discharge.   HISTORY OF PRESENT ILLNESS:  This 70 year old female was brought to ED after  having had a fall at home.  She was evaluated by the ED physician and  subsequently was admitted.  Apparently the patient became dizzy while  walking.  She had recently been evaluated for abdominal pain.  She had a CT  of the abdomen, which showed thickening in the area of gallbladder and area  of sigmoid colon.  The patient was too weak to stand on her own; uses  walker.  She was felt to have bronchial pneumonia on admission, and was  started on IV antibiotics.  She was noted to be hypoxic.   PHYSICAL EXAMINATION:  VITAL SIGNS:  An alert white female, with blood  pressure 129/75, respirations 20, pulse 129.  HEENT:  Eyes:  PERRLA.  TM's negative.  Oropharynx benign.  NECK:  Supple, no JVD or thyroid abnormalities.  HEART:  Regular rhythm, no murmurs.  LUNGS:  Clear to P&A.  ABDOMEN:  No palpable _organs_________ or masses.  EXTREMITIES:  Free of edema.  NEUROLOGIC:  No focal deficit.  SKIN:  Dermatitis of extremities.   LABORATORY DATA:  Blood gases on admission:  pH 7.472, pCO2 32.2, pO2 54.7,  bicarb 23.3.  Subsequent  blood gas on Dec 02, 2005 showed:  pH 7.380 with  pCO2 31.5, pO2 76.7.  CBC:  WBC 12,600, hemoglobin 12.1, hematocrit 35.0.  Subsequent CBC on Dec 08, 2005:  WBC 7200, hemoglobin 12.9, hematocrit 37.5.  CHEMISTRIES (on admission):  Sodium 133, potassium 3.1, chloride 101, CO2  23, glucose 112, BUN 20, creatinine 1.4, calcium 9.1, total protein 6.8,  albumin 3.1.  Subsequent Chemistries on Dec 01, 2005:  Sodium 131, potassium  4.0, chloride 100, CO2 20, glucose 70, BUN 23, creatinine 1.2, calcium 8.9.  Liver Enzymes:  SGOT 23, SGPT 18, alkaline phosphatase 118, bilirubin 1.1,  lipase 14.  Subsequent Enzymes on Dec 08, 2005:  SGOT 19, SGPT 11, alkaline  phosphatase  105, bilirubin 0.5.  On admission myoglobin greater than 500.  CPK-MB 1.1.  ACUTE ABDOMINAL SERIES (on admission):  Right infrahilar focal  infiltrate; no bowel obstruction or free air.  Calcified degenerative  fibroid.  HEAD CT:  No acute intracranial abnormality, with interval left  PCA infarct noted.  Acute or chronic left maxillary sinusitis.  ULTRASOUND  OF ABDOMEN:  Gallstone, no secondary signs of acute cholecystitis.  Fatty  infiltration of the liver.  Probable right renal cyst.  HEPATOBILIARY SCAN:  Showed abnormal gallbladder response to fatty meal stimulation, decreased  GBEF.  CHEST X-RAY:  Shows the progression of the right lower lobe airspace  disease, consistent with pneumonia.  Subsequently x-rays revealed an  improving right lower lobe pneumonia.  ELECTROCARDIOGRAM:  Sinus  tachycardia.  Left atrial enlargement.   HOSPITAL COURSE:  The patient, at the time of her admission, was placed on a  regular diet, IV normal saline at 50 cc/hr, IV Rocephin 1 g daily.  A sputum  culture and Gram strain done.  Subcutaneous Lovenox 40 mg daily.   The patient was noted to be markedly hypokalemic.  Following admission was  given 3 runs of Kay-Ciel on Nov 30, 2005.  The patient had an abdominal  ultrasound, which revealed  evidence of gallstone, but not cholecystitis.  Hydrocortisone cream 2.5%  in Sarna was applied to the legs twice a day.  She was continued on Synthroid 75 mcg daily, hydrochlorothiazide 25 mg  daily.   The patient continued to have abdominal pain.  A HIDA scan was abnormal.  She was continued on IV Rocephin; IV Zithromax was added her regimen.  She  was continued on 1-2 mg of IV Dilaudid q.3 h. p.r.n. for pain.   The patient slowly improved.  She was seen in consultation by Dr. Lovell Sheehan,  with reference to possible cholecystectomy.  He recommended that we delay  this until she was improved with reference to her pulmonary problems.  The  patient did show progression initially of pneumonia, but this began to  improve.  On Dec 04, 2005 she was started on Lexapro 5 mg daily.  The  patient did begin to show improvement; it was felt she could be discharged,  followed as an outpatient and continued on antibiotics at home until she was  able to return for cholecystectomy.   DISCHARGE MEDICATIONS:  The patient was discharged on:  1.  Benicar 20 mg daily.  2.  Percocet 5/325 mg one q.4 h. p.r.n. pain.  3.  Levaquin 750 mg daily.  4.  Catapres 0.1 mg b.i.d.  5.  Lexapro 10 mg daily.  6.  Hydrochlorothiazide 25 mg daily.  7.  Synthroid 0.75 mg daily.  8.  Hydrocortisone in Sarna 2.5% cream; 1:1 applied b.i.d. to dermatitis on      the legs.      Angus G. Renard Matter, MD  Electronically Signed     AGM/MEDQ  D:  01/01/2006  T:  01/01/2006  Job:  [id number]

## 2010-12-09 NOTE — Group Therapy Note (Signed)
NAME:  [name], [name]                  ACCOUNT NO.:  [id number]   MEDICAL RECORD NO.:  [id number]          PATIENT TYPE:  INP   LOCATION:  A201                          FACILITY:  [facility]   PHYSICIAN:  Mila Homer. Sudie Bailey, M.D.DATE OF BIRTH:  12-Feb-1941   DATE OF PROCEDURE:  05/08/2004  DATE OF DISCHARGE:                                   PROGRESS NOTE   SUBJECTIVE:  She is generally feeling better today and is having less pain.  She notes she has been having skin changes on the legs starting about a year  ago gradually getting worse.   OBJECTIVE:  VITAL SIGNS:  Temperature is 96.7, pulse 64, respirations 18,  blood pressure 140/85.  GENERAL:  She is sitting up in bed, feet on the floor.  She is well-  developed, well-nourished, in no acute distress.  HEENT:  She has thickening and coarsening of facial features.  EXTREMITIES:  She has scaliness of both legs and knees down, right worse  than left.  She has numerous erythematous macules, as much as 1 cm in  diameter on both legs anteriorly, around the knees particularly.  HEART:  Regular rhythm.  LUNGS:  Clear throughout.  Moving air well.  NECK:  She is swallowing normally.   ASSESSMENT:  1.  Rhabdomyolysis.  2.  Anemia.  3.  Hypokalemia.  4.  Hyponatremia.  5.  Hypothyroidism.  6.  Skin rash, question etiology (question secondary to hypothyroidism).   PLAN:  We are repeating a CBC and Met-7 tomorrow.  Continue Synthroid 25 mcg  which was just started yesterday.  She will obviously need this increased  significantly over the next few weeks to months.  She is to continue with  KCL 20 mEq daily, metoprolol 25 mg b.i.d., hydrocodone p.r.n.      SDK/MEDQ  D:  05/08/2004  T:  05/08/2004  Job:  [id number]

## 2010-12-09 NOTE — Group Therapy Note (Signed)
NAME:  [name], [name]                  ACCOUNT NO.:  [id number]   MEDICAL RECORD NO.:  [id number]          PATIENT TYPE:  INP   LOCATION:  A201                          FACILITY:  [facility]   PHYSICIAN:  Angus G. Renard Matter, MD   DATE OF BIRTH:  02/11/1941   DATE OF PROCEDURE:  12/17/2005  DATE OF DISCHARGE:                                   PROGRESS NOTE   This patient has a history of bronchopneumonia, cholelithiasis.  Her  pneumonia has improved.  Her dehydration and prerenal azotemia have  improved.  The patient's BUN currently is 18 with creatinine 1.4.  She  continues to have midabdominal pain.   OBJECTIVE:  Vital signs:  Blood pressure 134/74, respirations 20, pulse 87,  temperature 97.8.  Her thyroid studies:  TSH 7.895.  The patient still has  generalized weakness.  Lungs clear.  Heart regular rhythm.  Abdomen with  slight tenderness in the upper abdomen.   ASSESSMENT:  1.  Cholelithiasis.  2.  History of bronchopneumonia.  3.  History of prerenal azotemia, which is improved.   PLAN:  Obtain a surgical consultation concerning possible cholecystectomy.      Angus G. Renard Matter, MD  Electronically Signed     AGM/MEDQ  D:  12/17/2005  T:  12/18/2005  Job:  [id number]

## 2010-12-09 NOTE — Group Therapy Note (Signed)
NAME:  [name], [name]                  ACCOUNT NO.:  [id number]   MEDICAL RECORD NO.:  [id number]          PATIENT TYPE:  INP   LOCATION:  A201                          FACILITY:  [facility]   PHYSICIAN:  Angus G. Renard Matter, MD   DATE OF BIRTH:  1940-08-20   DATE OF PROCEDURE:  DATE OF DISCHARGE:  12/26/2005                                   PROGRESS NOTE   This patient had laparoscopic cholecystectomy.  She has less abdominal pain.  She appears to be improved with reference to the muscle contraction of the  back of her neck.  Her chemistries remain relatively normal.   OBJECTIVE:  VITAL SIGNS:  Blood pressure 143/99, respirations 24, pulse 84,  temperature 98.4.  There is no generalized weakness.  HEART:  Regular rhythm.  LUNGS:  Diminished breath sounds.  The patient has tenderness around incisions on the abdomen following  cholecystectomy.   ASSESSMENT:  The patient is recovering following cholecystectomy.  She did  have problem with dehydration, hypothyroidism and hypokalemia.  The patient  is awaiting nursing home placement.      Angus G. Renard Matter, MD  Electronically Signed     AGM/MEDQ  D:  12/26/2005  T:  12/26/2005  Job:  [id number]

## 2010-12-09 NOTE — Group Therapy Note (Signed)
NAME:  [name], [name]                  ACCOUNT NO.:  [id number]   MEDICAL RECORD NO.:  [id number]          PATIENT TYPE:  INP   LOCATION:  A201                          FACILITY:  [facility]   PHYSICIAN:  Angus G. Renard Matter, MD   DATE OF BIRTH:  [birth year]   DATE OF PROCEDURE:  DATE OF DISCHARGE:                                   PROGRESS NOTE   The patient has a history of bronchopneumonia, cholelithiasis.  The  pneumonia has improved.  Her dehydration and prerenal azotemia has improved.  The patient is awaiting cholecystectomy.   Her current electrolytes are sodium 129, potassium 2.9, chloride 99, CO2 21,  glucose 77.   OBJECTIVE:  VITAL SIGNS:  Blood pressure 166/87, respirations 20, pulse 85,  temperature 98.4.  HEART:  Regular rhythm.  LUNGS:  Clear to P&A.  ABDOMEN:  No palpable masses, but some tenderness in the upper abdomen.   ASSESSMENT:  The patient has been admitted with above-stated problems.  She  has improved.  She does have cholelithiasis.   PLAN:  To continue current regimen.  Patient awaiting surgery.      Angus G. Renard Matter, MD  Electronically Signed     AGM/MEDQ  D:  12/18/2005  T:  12/18/2005  Job:  [id number]

## 2010-12-09 NOTE — H&P (Signed)
NAME:  [name] [name]                  ACCOUNT NO.:  [id number]   MEDICAL RECORD NO.:  [id number]          PATIENT TYPE:  INP   LOCATION:  A313                          FACILITY:  [facility]   PHYSICIAN:  Angus G. Renard Matter, MD   DATE OF BIRTH:  04-Nov-1940   DATE OF ADMISSION:  11/29/2005  DATE OF DISCHARGE:  LH                                HISTORY & PHYSICAL   HISTORY OF PRESENT ILLNESS:  [age]-year-old white female was brought to  the ED after having had a fall at home.  She was evaluated by the ED  physician and subsequently admitted.  Apparently, the patient became dizzy  while walking.  She had recently been evaluated for abdominal pain.  She had  a CT of the abdomen which showed thickening in the area of the gallbladder  and in area of sigmoid colon.  The patient was too weak to stand on her own,  uses a walker since a prior leg injury, apparently felt to have  bronchopneumonia, was started on IV antibiotics, also was noted to be  hypoxic.   SOCIAL HISTORY:  The patient does not smoke or drink alcohol.   FAMILY HISTORY:  Unknown.   PAST MEDICAL AND SURGICAL HISTORY:  The patient has a history of chronic  hypothyroidism, previous history of peptic ulcer disease, and more recent  abdominal pain, which has been evaluated.   ALLERGIES:  The patient has no known allergies.   MEDICATION LIST:  _hctz____ , Synthroid.   REVIEW OF SYSTEMS:  HEENT:  Negative.  CARDIOPULMONARY:  No chest pain, but  cough intermittently.  GU:  No dysuria or hematuria.  SKIN:  Positive for  rash.   PHYSICAL EXAMINATION:  GENERAL:  Alert white female.  VITAL SIGNS:  Blood pressure 129/75, respirations 20, pulse 129.  HEENT:  Eyes:  PERRLA.  TMs negative.  Oropharynx benign.  NECK:  Supple with no JVD or thyroid abnormalities.  HEART:  Regular rhythm with no murmurs.  LUNGS:  Clear to P&A.  ABDOMEN:  No palpable organs or masses.  EXTREMITIES:  Free of edema.  NEUROLOGIC:  No focal deficit.  SKIN:   Dermatitis of extremities.   DIAGNOSIS:  1.  Weakness from fall.  2.  Bronchopneumonia.  3.  Hypothyroidism.  4.  History of possible gallbladder disease.  5.  Dermatitis of legs.   PERTINENT LABORATORY DATA:  CBC:  WBC 12,800 with hemoglobin of 12.1 and  hematocrit of 35.  Chemistries:  Sodium 133, potassium 3.1, chloride 101,  CO2 23, glucose 112, BUN 20, creatinine 1.4, calcium 9.1, total protein 6.8,  albumin 3.1.  FIO2 21%, oxygen 21.0, pH 7.472, PCO2 32.2, PO2 54.7,  bicarbonate 23.3.  Urinalysis negative.      Angus G. Renard Matter, MD  Electronically Signed     AGM/MEDQ  D:  11/30/2005  T:  11/30/2005  Job:  [id number]

## 2010-12-09 NOTE — Group Therapy Note (Signed)
NAME:  [name], [name]                  ACCOUNT NO.:  [id number]   MEDICAL RECORD NO.:  [id number]          PATIENT TYPE:  INP   LOCATION:  A201                          FACILITY:  [facility]   PHYSICIAN:  Angus G. Renard Matter, MD   DATE OF BIRTH:  02/05/1941   DATE OF PROCEDURE:  DATE OF DISCHARGE:                                   PROGRESS NOTE   SUBJECTIVE:  This patient had laparoscopic cholecystectomy done by Dr.  Lovell Sheehan. She is 2 days postop.   OBJECTIVE:  VITAL SIGNS:  Blood pressure 163/86, respirations 20, pulse 82,  temp 97.8. LAB DATA:  Hemoglobin 9.6, hematocrit 28.1. Chemistries, sodium  135, potassium 4.2, chloride 103, CO2 27, glucose 99, BUN 2, creatinine 0.9.  HEART:  Regular rhythm. LUNGS:  Clear. ABDOMEN:  The patient has some  tenderness around incision sites following a recent cholecystectomy.   PLAN:  Continue current regimen. Will institute physical therapy when  appropriate.      Angus G. Renard Matter, MD  Electronically Signed     AGM/MEDQ  D:  12/21/2005  T:  12/21/2005  Job:  [id number]

## 2010-12-09 NOTE — Group Therapy Note (Signed)
NAME:  [name], [name]                  ACCOUNT NO.:  [id number]   MEDICAL RECORD NO.:  [id number]          PATIENT TYPE:  INP   LOCATION:  A201                          FACILITY:  [facility]   PHYSICIAN:  Angus G. Renard Matter, MD   DATE OF BIRTH:  16-Mar-1941   DATE OF PROCEDURE:  12/20/2005  DATE OF DISCHARGE:                                   PROGRESS NOTE   This patient yesterday had a laparoscopic cholecystectomy by Dr. Lovell Sheehan.  She tolerated the procedure well.  Did have elevated blood pressure in  surgery and was medicated for this.  Her current blood pressures are down,  and she is more stable.   OBJECTIVE:  VITAL SIGNS:  Blood pressure 121/68, respirations 18, pulse 87,  temperature 96.5.  LUNGS:  Clear.  HEART:  Regular rhythm.  ABDOMEN:  The patient has tenderness around the incisions in the upper  abdomen.   Hemoglobin 9.6, hematocrit 28.1.  The patient's electrolytes:  Sodium 131,  potassium 3.3, chloride 103, CO2 23, glucose 147, BUN 41.   ASSESSMENT:  The patient had cholecystectomy for cholelithiasis yesterday  successfully.  She did have a hypertensive episode in surgery, but this was  controlled.   PLAN:  To continue current regimen.      Angus G. Renard Matter, MD  Electronically Signed     AGM/MEDQ  D:  12/20/2005  T:  12/20/2005  Job:  [id number]

## 2010-12-09 NOTE — H&P (Signed)
NAME:  [name] [name]                  ACCOUNT NO.:  [id number]   MEDICAL RECORD NO.:  [id number]          PATIENT TYPE:  EMS   LOCATION:  ED                            FACILITY:  [facility]   PHYSICIAN:  [name] [name], M.D.DATE OF BIRTH:  March 08, 1941   DATE OF ADMISSION:  05/04/2004  DATE OF DISCHARGE:  LH                                HISTORY & PHYSICAL   This 70 year old was at home today when she apparently fell in the kitchen,  ended up on the floor for four hours and was in too much pain to get up.  By  history by the ER physician, she does not remember exactly how she fell.  When her husband returned from work, he was able to get her up, and  ambulance was called for transport to the hospital.   She tells me she is on no medications, has no drug allergies.  Surgery is  limited to abdominal surgery 18 years ago for internal bleeding.  (By her  history, this might be a gastric ulcer.)  She has been taken care of by Dr.  Gerda Diss until recently.  Apparently she switched physicians to Dr. Renard Matter  but has not actually seen him yet.   Admission temperature 97.1, blood pressure 207/113 with an automatic cuff,  rechecked 45 minutes later at 183/94, and then rechecked 45 minutes after  that at 170/107.  Pulse 93, respiratory rate 20, O2 saturation 97%.   She actually appeared a little slow to respond initially, but despite speech  which was somewhat halting, she seemed to be able to give a pretty good  history.  It was difficult to understand her words, however.  I was not sure  if this was due to IV narcotics for pain or if it was her normal status.  There was no family present when I examined and talked to her.  There was a  nurse present.   Sentence structure appeared to be intact.  She had no slurring of speech.  Her heart had a regular rhythm with rate of 70.  Her lung were clear  throughout, moving air well.  The abdomen appeared to be soft without  hepatosplenomegaly or mass.   She was tender over the right hip, and I could  not palpate pulses in either foot.  Feet were somewhat cool bilaterally.  There was no edema and no varicosities.   EKG had nonspecific changes and a rate of 85.   White cell count 12,600 with 89% neutrophils, 4% lymphs.  Hemoglobin 12.2,  hematocrit 35.5, MCV 102.5, platelet count 248,000.  Serum sodium 129,  potassium 3.4, chloride 95, bicarb 25, glucose 101, BUN 10, creatinine 0.9.  Albumin was 4.4.  Myoglobin was greater than 500 ng/ml (normal 12 to 199).  CK-MB was 68.2 (normal 1.0 to 7.9).  Troponin I was 0.10 (normal 0.00 to  0.05).  UA showed pH of 7.0, specific gravity 1.020, moderate hemoglobin,  protein greater than 300 mg/dl.   X-rays showed a superior ramus fracture of the right pelvic region.  This  was relayed to  me by the ER physician.   ADMISSION DIAGNOSES:  1.  Right superior ramus fracture.  2.  Essential hypertension.  3.  Elevated cardiac enzymes.  4.  Electrolyte abnormalities including hyponatremia, hypokalemia.  5.  Elevated MCV.   PLAN:  1.  The patient is being admitted to the hospital.  2.  Cardiac monitor with cardiac enzymes q.6h.  3.  Repeat EKG in the morning.  4.  We will get cardiology involved and orthopedics involved.  5.  We will treat with normal saline IV and KCl 20 mEq p.o. b.i.d.  6.  Check B12 and folate level due to the elevated MCV.  7.  Try to get more history and possible medications.  8.  I discussed her case with local physician, Dr. Renard Matter.  9.  Will add a beta blocker.  10. Check another hemoglobin in the morning.  11. Hold on ASA at present.      SDK/MEDQ  D:  05/04/2004  T:  05/04/2004  Job:  [id number]

## 2010-12-09 NOTE — Consult Note (Signed)
NAME:  [name] [name]                  ACCOUNT NO.:  [id number]   MEDICAL RECORD NO.:  [id number]          PATIENT TYPE:  INP   LOCATION:  A313                          FACILITY:  [facility]   PHYSICIAN:  R. Roetta Sessions, M.D. DATE OF BIRTH:  [birth year]   DATE OF CONSULTATION:  11/30/2005  DATE OF DISCHARGE:                                   CONSULTATION   REASON FOR CONSULTATION:  Abnormal sigmoid colon on CT.   REFERRING PHYSICIAN:  Angus G. Renard [name], M.D.   HISTORY OF PRESENT ILLNESS:  The patient is a 70 year old Caucasian female  patient of [name] [name], M.D. who was admitted to the hospital today  after presenting with complaints of falling, dizziness, and weakness.  She  had been seen in the ED 2 days prior for abdominal pain and had workup.  A  CT of the abdomen and pelvis at that time revealed a right hepatic lobe  lesion measuring 4 mm, too small to characterize, prominent or pronounced  gallbladder fold in the fundus with irregular wall enhancement, and punctate  calcifications, other suspected gallstones, small amount of pericholecystic  fluid.  In the rectosigmoid colon there was prominence of the wall distally,  although, it was not well distended.  The patient was sent home after the CT  evaluation to follow up with Dr. Renard [name].  However, today, she was brought  back in by the family because of falling, feeling very weak.  She continues  to complain of some abdominal pain.  The pain is primarily in the left lower  abdomen.  Denies any nausea and vomiting.  Denies any weight loss,  constipation, diarrhea, melena, or rectal bleeding.  No heartburn.  She had  a chest x-ray revealing right infrahilar focal infiltrate versus pneumonia.  She is currently on Rocephin.  White count was 12,600.  She did have some  hypoxemia.  Hemoglobin is 12.1, hematocrit 35, total bilirubin 1.1, alkaline  phosphatase 118 and 2 days ago it was 153.  AST 23, ALT 18, albumin 3.1,  lipase 14.  Met-7  unremarkable except sodium of 131.  INR 1.  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   MEDICATIONS AT HOME:  1.  Synthroid.  2.  Hydrochlorothiazide.  3.  Tylenol.   ALLERGIES:  No known drug allergies.   PAST MEDICAL HISTORY:  Hypothyroidism, history of remote peptic ulcer  disease requiring surgery over 20 years ago.  Admitted in November of 2005  for a pubis ramus fracture.  She has never had a colonoscopy.   FAMILY HISTORY:  Negative for colorectal cancer, chronic GI illnesses.   SOCIAL HISTORY:  She is married with two children.  She smokes a pack of  cigarettes a day.  No alcohol use.   REVIEW OF SYSTEMS:  See HPI for GI, constitutional.  CARDIOPULMONARY:  She  denies any chest pain or shortness of breath, but complains of cough.   PHYSICAL EXAMINATION:  VITAL SIGNS:  Temperature 98.6, pulse 89,  respirations 20, blood pressure 152/73.  GENERAL:  Pleasant, elderly, Caucasian female in no acute distress.  SKIN:  Warm and dry, no jaundice.  HEENT:  Sclerae anicteric, oropharyngeal mucosa moist and pink.  Dentition  in poor repair.  No lymphadenopathy.  CHEST:  Lungs reveal scattered rhonchi.  HEART:  Regular rate and rhythm with no murmurs, rubs, or gallops.  ABDOMEN:  Positive bowel sounds, soft, and nondistended.  Minimal left lower  quadrant tenderness to deep palpation.  No organomegaly or masses.  No  rebound tenderness or guarding.  No abdominal bruits or hernias.  EXTREMITIES:  No edema.   LABORATORY DATA:  As in HPI.   IMPRESSION:  The patient is a 70 year old lady lady with recent abdominal pain  prompting CT scan.  Initially, there was some suggestion of gallbladder wall  thickness with gallstones present and a small amount of pericholecystic  fluid.  Abdominal ultrasound done today revealed no evidence of  cholecystitis, but she does  have gallstones.  An additional CT in the region  of her sigmoid colon, there was some underdistention, but there was concern  about possible wall thickening.  She has never had a colonoscopy and needs  to have this further evaluated, although, this may be a false finding.  Currently she is being treated for pneumonia.   RECOMMENDATIONS:  1.  She will need to have a colonoscopy at some point when deemed stable      from pulmonary standpoint.  2.  Consider Hiatus scan to rule out further biliary disease.  No evidence      of acute cholecystitis at this time, however.  3.  Further recommendations will follow.      Tana Coast, P.AJonathon Bellows, M.D.  Electronically Signed    LL/MEDQ  D:  11/30/2005  T:  11/30/2005  Job:  [id number]   cc:   [name] G. Renard Matter, MD  Fax: [phone]

## 2010-12-09 NOTE — Group Therapy Note (Signed)
NAME:  [name], [name]                  ACCOUNT NO.:  [id number]   MEDICAL RECORD NO.:  [id number]          PATIENT TYPE:  INP   LOCATION:  A313                          FACILITY:  [facility]   PHYSICIAN:  Angus G. Renard Matter, MD   DATE OF BIRTH:  Jun 28, 1941   DATE OF PROCEDURE:  12/05/2005  DATE OF DISCHARGE:                                   PROGRESS NOTE   This patient has right lower lobe air space disease, consistent with  pneumonia.  She also does have known gallstones and questionable pathology  in the sigmoid colon.  The patient continues to have intermittent abdominal  pain.   OBJECTIVE:  VITAL SIGNS:  Blood pressure 171/85, respirations 20, pulse 77,  temperature 97.4.  LUNGS:  Diminished breath sounds.  HEART:  Regular rhythm.  ABDOMEN:  Slight tenderness in the upper abdomen.   ASSESSMENT:  1.  The patient has right lower lobe pneumonia.  2.  Does have known gallstones and abdominal pain.   PLAN:  1.  Plan to repeat chest x-ray.  2.  Continue current regimen.  3.  Will obtain surgical consultation.      Angus G. Renard Matter, MD  Electronically Signed     AGM/MEDQ  D:  12/05/2005  T:  12/05/2005  Job:  [id number]

## 2010-12-09 NOTE — Group Therapy Note (Signed)
NAME:  [name], [name]                  ACCOUNT NO.:  [id number]   MEDICAL RECORD NO.:  [id number]          PATIENT TYPE:  INP   LOCATION:  A313                          FACILITY:  [facility]   PHYSICIAN:  Angus G. Renard Matter, MD   DATE OF BIRTH:  [birth year]   DATE OF PROCEDURE:  12/06/2005  DATE OF DISCHARGE:                                   PROGRESS NOTE   This patient has right lower air space disease consistent with pneumonia.  The repeat chest x-ray shows improving right lower lobe infiltrate with  residual anterior right upper lobe infiltrate and interval progression of  mid-thoracic compression deformity.   OBJECTIVE:  VITAL SIGNS:  Blood pressure 172/92, respirations 20, pulse 75,  temperature 98.9.  GENERAL:  The patient continues to have midabdominal pain.  She continues to  have generalized weakness.  LUNGS:  Diminished breath sounds.  HEART:  Regular rhythm.  ABDOMEN:  Slight tenderness in midabdomen.   ASSESSMENT:  The patient was admitted with abdominal pain.  She does have  known cholelithiasis, right lower lobe pneumonia which is improving.  She  does have compression deformity of thoracic spine.   PLAN:  To obtain surgical consult for later cholecystectomy, following  further improvement of pulmonary problems and pneumonia.      Angus G. Renard Matter, MD  Electronically Signed     AGM/MEDQ  D:  12/06/2005  T:  12/06/2005  Job:  [id number]

## 2010-12-09 NOTE — Group Therapy Note (Signed)
NAME:  [name], [name]                  ACCOUNT NO.:  [id number]   MEDICAL RECORD NO.:  [id number]          PATIENT TYPE:  INP   LOCATION:                                FACILITY:  [facility]   PHYSICIAN:  Angus G. Renard Matter, M.D. DATE OF BIRTH:  1940-12-24   DATE OF PROCEDURE:  DATE OF DISCHARGE:                                   PROGRESS NOTE   SUBJECTIVE:  This patient is having less pain and is feeling some better.  She was admitted to the hospital with pubic ramus fracture and abnormal  cardiac enzymes.  Her condition remains stable.   PHYSICAL EXAMINATION:  VITAL SIGNS:  Blood pressure 147/78, respirations 20,  pulse 55, temperature 97.6.  LUNGS:  Clear to percussion and auscultation.  HEART:  Regular rhythm.  ABDOMEN:  No palpable organs or masses.   ASSESSMENT:  1.  The patient was admitted with fractured pubic ramus.  2. Rhabdomyolysis.      3. Hypokalemia.  4. Anemia.   PLAN:  Have physical therapy attempt ambulation.       ___________________________________________  Ishmael Holter Renard Matter, M.D.    AGM/MEDQ  D:  05/09/2004  T:  05/09/2004  Job:  [id number]

## 2010-12-09 NOTE — Consult Note (Signed)
NAME:  [name], [name]                  ACCOUNT NO.:  [id number]   MEDICAL RECORD NO.:  [id number]          PATIENT TYPE:  INP   LOCATION:  A201                          FACILITY:  [facility]   PHYSICIAN:  Jorja Loa, M.D.DATE OF BIRTH:  1941-07-03   DATE OF CONSULTATION:  12/14/2005  DATE OF DISCHARGE:                                   CONSULTATION   REASON FOR CONSULT:  Elevated BUN and creatinine.   Ms. [name] is a 70 year old white female with past medical history of  hypothyroidism, hypertension, and recently admitted with nausea and  vomiting, and during that time she was found to have gallbladder disease.  However, since the patient has also bronchopneumonia, surgery was not done,  and the patient was put on p.o. antibiotics and discharged home.  Presently,  came with complaints of still weakness, poor appetite, and some nausea, and  she was found to have elevated BUN and creatinine of 84 and 6.2.  Admitted  to the hospital for further workup and pain management.  Presently, the  patient denies any previous history of renal failure.  No history of kidney  stones.  Overall, no history of any problems regarding her kidneys.  Presently, still she has some abdominal pain.  Her appetite is not that  great.  She denies any shortness of breath, dizziness, or light-headedness.   PAST MEDICAL HISTORY:  1.  As stated above, patient with severe hypothyroidism.  2.  Hypertension.  3.  Anxiety disorder.  4.  Gallbladder disease.  5.  History peptic ulcer disease.  6.  History of recent pneumonia.  7.  History of a fall.  8.  History of pelvic fracture.   ALLERGIES:  No known allergies.   MEDICATIONS:  1.  Zosyn 2.5 mg IV q.6 h.  2.  She is getting IV fluid at 125 cc per hour.  3.  She is also getting morphine.  4.  Tylenol.  5.  When the patient was discharged, she was on Synthroid.  6.  Also, she was on hydrochlorothiazide 25 mg p.o. daily.  7.  Lexapro 10 mg p.o. daily.  8.   Catapres 0.1 mg p.o. daily.  9.  Levaquin 750 mg p.o. daily for 7 days.  10. Benicar 20 mg p.o. daily.   SOCIAL HISTORY:  She has a history of smoking for a long period.  No history  of alcohol abuse, and no history of drug abuse.   FAMILY HISTORY:  No history of renal failure.   REVIEW OF SYSTEMS:  Mainly with poor appetite, some nausea but no vomiting.  No fever, chills, or sweating.  No chest pain.  No diarrhea.  The patient  states that she is making some urine.  However, because of her weakness she  wets her bed.  She needs some help to go to the bathroom.  She denies any  chest pain, urgency, or frequency.   PHYSICAL EXAMINATION:  VITAL SIGNS:  Temperature 97.2, blood pressure 99/41,  pulse 78.  HEENT:  The patient looks very emaciated.  No conjunctival pallor.  Anicteric.  Oral mucosa seems to be dry.  CHEST:  Clear to auscultation.  No rales, no rhonchi, and no egophony.  HEART:  Regular rate and rhythm.  No murmurs.  ABDOMEN:  Soft.  Positive bowel sounds.  EXTREMITIES:  No edema.   She has 2000 input and 650 output, with 1500 cc per fluid bolus.  Her blood  work today is BUN 71, creatinine 4.1, sodium 136, potassium 4.5, CO2 24.  Her white blood cell count is 8.9, hemoglobin 10.5, hematocrit 30.5, and  platelets 223.  She has TSH of 27.51.  The last time she was here, which was  in May 2007, her creatinine was normal at 1.2.  That was Dec 01, 2005.  BUN  was 23.  Her UA during that time:  Specific gravity was 1.02, pH 5, no  protein.   ASSESSMENT:  1.  Renal insufficiency.  At this moment, it seems to be acute, possibly      secondary to severe prerenal syndrome, as the patient's p.o. intake is      very limited, and also she was on hydrochlorothiazide diuretics and ACE      inhibitor.  All may contribute to her renal insufficiency.  At this      moment, the patient's BUN and creatinine seem to be improving with      hydration.  Overall, interstitial nephritis also  needs to be considered,      since the patient was also on  Levaquin when she was discharged.  2.  History of hypertension.  BP seems to be borderline normal.  She is not      on any antihypertensive medication at this moment.  3.  History of hypothyroidism.  The patient was supposed to be on Synthroid.      Probably, the TSH is elevated, probably could be associated with poor      p.o. intake, with supplement.  4.  History of gallbladder disease.  5.  History of pneumonia.  The patient was on antibiotics, presently on      Zosyn.  Her white blood cell count is normal.  She is afebrile.  6.  History of anxiety disorder.   RECOMMENDATIONS:  IV hydration.  Will continue with BUN and following her  blood work.  At this moment, the IV rate seems to be reasonable.  Since her  BUN and creatinine are improving, we do not need to adjust her fluid.  If  her renal function stabilizes, probably will do ultrasound at that time,  especially if there is no improvement.  Otherwise, we will continue with the  other medications.  Will do also iron studies, as her H&H seem to be also  declining.      Jorja Loa, M.D.  Electronically Signed     BB/MEDQ  D:  12/14/2005  T:  12/14/2005  Job:  [id number]

## 2010-12-09 NOTE — Group Therapy Note (Signed)
NAME:  [name]                  ACCOUNT NO.:  [id number]   MEDICAL RECORD NO.:  [id number]          PATIENT TYPE:  INP   LOCATION:  A201                          FACILITY:  [facility]   PHYSICIAN:  Angus G. Renard Matter, MD   DATE OF BIRTH:  Dec 07, 1940   DATE OF PROCEDURE:  12/16/2005  DATE OF DISCHARGE:                                   PROGRESS NOTE   PATIENT NAME: _[name]________   This patient does have a history of bronchopneumonia and cholelithiasis.  The pneumonia has improved.  Her dehydration and prerenal azotemia has  improved.  His electrolytes shows a BUN of 18, creatinine 1.4.  The patient  continues to have mid-abdominal pain.   OBJECTIVE:  VITAL SIGNS:  Blood pressure 145/85, respirations 20, pulse 94,  temperature 98.6.  GENERAL:  The patient has an unsteady gait and is generally weak.  HEART:  Regular rhythm.  LUNGS:  Clear.  ABDOMEN:  Tender in mid-abdomen.   ASSESSMENT:  The patient has improved.  She does have cholelithiasis, and  was hospitalized recently with pneumonia which has improved.  Her  dehydration and prerenal azotemia has improved.   PLAN:  Plan to discuss with the surgeon concerning the need for  cholecystectomy.      Angus G. Renard Matter, MD  Electronically Signed     AGM/MEDQ  D:  12/16/2005  T:  12/16/2005  Job:  [id number]

## 2010-12-09 NOTE — Group Therapy Note (Signed)
NAME:  [name], [name]                  ACCOUNT NO.:  [id number]   MEDICAL RECORD NO.:  [id number]          PATIENT TYPE:  INP   LOCATION:  A313                          FACILITY:  [facility]   PHYSICIAN:  Angus G. Renard Matter, MD   DATE OF BIRTH:  23-Jul-1941   DATE OF PROCEDURE:  12/04/2005  DATE OF DISCHARGE:                                   PROGRESS NOTE   This patient has right lower air space disease consistent with pneumonia,  possible cavitation, and subsequent necrosis.  The patient does have also  known gallstones and questionable pathology in sigmoid colon.  The patient  continues to have some intermittent abdominal pain.   OBJECTIVE:  VITAL SIGNS:  Blood pressure 159/81, respirations 20, pulse 66,  temperature 97.  LUNGS:  Diminished breath sounds.  HEART:  Regular rhythm.  ABDOMEN:  Slight tenderness mid abdomen.   ASSESSMENT:  The patient has known right lower lobe pneumonia.  She does  have cholelithiasis.   PLAN:  Continue current antibiotics.  We will follow with cholecystectomy  when the patient is stable enough for this procedure.      Angus G. Renard Matter, MD  Electronically Signed     AGM/MEDQ  D:  12/04/2005  T:  12/04/2005  Job:  [id number]

## 2010-12-09 NOTE — Consult Note (Signed)
NAME:  [name] [name]                  ACCOUNT NO.:  [id number]   MEDICAL RECORD NO.:  [id number]          PATIENT TYPE:  INP   LOCATION:  A201                          FACILITY:  [facility]   PHYSICIAN:  Daykin Bing, M.D.  DATE OF BIRTH:  08/05/1940   DATE OF CONSULTATION:  05/05/2004  DATE OF DISCHARGE:                                   CONSULTATION   REFERRING PHYSICIAN:  Dr. Renard Matter   HISTORY OF PRESENT ILLNESS:  A 70 year old woman admitted to hospital after  fall with injuries, apparently related to dizziness or syncope.  [name] [name]  has no significant past cardiac history.  She has never seen a cardiologist  nor had any significant cardiac evaluation.  She has a history of mild and  transient intermittent dizziness with only a remote episode of syncope as a  young woman.  She felt well up to the time she was mopping her floor when  she developed dizziness and then fell to the ground.  She denies loss of  consciousness.  She had no oral injury nor incontinence.  She was unable to  rise and lay on the floor for a number of hours until her husband returned  home.  She was transported to the emergency department where she was found  to have a fracture of her superior pelvic ramus.  She has otherwise felt  well since admission.   PAST MEDICAL HISTORY:  Essentially unremarkable.  She underwent abdominal  surgery 18 years ago for hemorrhage, presumably GI bleeding.  She was seen  in the emergency department at [facility] two years ago after a fall with an  ankle injury but this clearly was unrelated to potential syncope.   MEDICATIONS:  She takes no medications routinely.   ALLERGIES:  She reports no medical allergies.   SOCIAL HISTORY:  Res[place] in [place].  Married with two adult children.  25-pack-year history of cigarette smoking.  Denies excessive use of alcohol  or drugs.   FAMILY HISTORY:  Mother died from myocardial infarction.  No information  regarding father's health.   Five siblings, two of whom are deceased.  No  history of vascular disease.   REVIEW OF SYSTEMS:  Occasional vertigo, history of skin rash, chronic right  hip arthralgias.  All other systems reviewed and are negative.   PHYSICAL EXAMINATION:  GENERAL:  Pleasant woman with difficult to understand  speech in no acute distress.  VITAL SIGNS:  Temperature 97.9, heart rate 64 and regular, respirations 20,  blood pressure 125/80.  Of note was an initial blood pressure of 207/113 in  the emergency department.  HEENT:  Anicteric sclerae.  Normal lids and conjunctivae.  Poor dentition.  NECK:  No jugular venous distention.  Normal carotid upstrokes without  bruits.  HEMATOPOETIC:  No cervical, axillary, or inguinal adenopathy.  ENDOCRINE:  No thyromegaly.  SKIN:  No significant lesions.  CHEST:  Clear lung fields.  Somewhat decreased breath sounds at the bases.  CARDIAC:  Normal first and second heart sounds.  ABDOMEN:  Soft and nontender.  No bruits.  No organomegaly.  No masses.  NEUROMUSCULAR:  Symmetric strength and tone.  Cranial nerves intact.  MUSCULOSKELETAL:  No significant joint deformities.  EXTREMITIES:  Ichthyosis over the skin of the lower legs.   Chest x-ray:  Pending.   EKG:  Normal sinus rhythm.  Slight QT prolongation.  T-wave inversions V2-  V6.  No prior tracing for comparison.   Other laboratory notable for anemia with a hemoglobin of 11.0 and a slightly  increased MCV, mild hypokalemia, and abnormal cardiac markers with total CK  of 5400, MB of 85, and troponin of 0.3.   IMPRESSION:  [name] [name] presents with an episode of brief syncope following  premonitory symptoms, a benign examination, and abnormal EKG and cardiac  markers.  The cardiac markers are most suggestive of a muscle source, likely  rhabdomyolysis related to prolonged immobility while she was on the floor.  There is some risk of renal failure associated with this level of  rhabdomyolysis.  It would be  prudent to infuse with a bicarbonate solution  over the next 24 hours in order to attempt to prevent this.   The history is not particularly suggestive of cardiac events.  However, she  does have EKG abnormalities.  Serial cardiac markers will be obtained as  well as follow-up EKGs.  There appears to be no prior tracings ever having  been obtained for comparison.  An echocardiogram is certainly in order and  perhaps a stress test at some point.   The loss of consciousness is most suggestive of a transient episode of  hypotension.  An arrhythmia is certainly possible, although none has yet  been seen on monitoring.  Neurocardiogenic syncope would be statistically  the most likely etiology.  Orthostatic vital signs will be obtained.  A  carotid sinus massage should be performed at some point.   A mild anemia is present.  Work-up is in order.  The patient would benefit  from smoking cessation.  We will be happy to follow this nice woman with you  and assist in determining whether she has significant cardiac problems.     Robe   RR/MEDQ  D:  05/06/2004  T:  05/06/2004  Job:  [id number]

## 2010-12-09 NOTE — Group Therapy Note (Signed)
NAME:  [name], [name]                  ACCOUNT NO.:  [id number]   MEDICAL RECORD NO.:  [id number]           PATIENT TYPE:   LOCATION:                                 FACILITY:   PHYSICIAN:  Angus G. Renard Matter, MD        DATE OF BIRTH:   DATE OF PROCEDURE:  12/07/2005  DATE OF DISCHARGE:                                   PROGRESS NOTE   This patient has cholelithiasis.  She has been treated for right lower lobe  pneumonia which is improving.  Patient continues to have abdominal pain  intermittently.   OBJECTIVE:  VITAL SIGNS:  Blood pressure 151/82, respirations 20, pulse 78,  temperature 97.2.  GENERAL:  Patient has generalized weakness.  LUNGS:  Diminished breath sounds.  HEART:  Irregular rhythm.  ABDOMEN:  Slight tenderness in upper abdomen.   ASSESSMENT:  Patient is being treated for right lower lobe pneumonia which  is improving.  She does have cholelithiasis, total colonoscopy recommended  prior to surgery.  Continue current regimen.      Angus G. Renard Matter, MD  Electronically Signed     AGM/MEDQ  D:  12/07/2005  T:  12/07/2005  Job:  [id number]

## 2010-12-09 NOTE — Group Therapy Note (Signed)
NAME:  Raffel, Teshara                  ACCOUNT NO.:  [id number]   MEDICAL RECORD NO.:  [id number]          PATIENT TYPE:  INP   LOCATION:  A313                          FACILITY:  [facility]   PHYSICIAN:  Angus G. Renard Matter, MD   DATE OF BIRTH:  Feb 14, 1941   DATE OF PROCEDURE:  12/03/2005  DATE OF DISCHARGE:                                   PROGRESS NOTE   This patient continues to have intermittent upper abdominal pain.  She does  have known cholelithiasis and she does have evidence of pneumonia right  lower lobe.  She currently is on IV antibiotics.  She did have hepatobiliary  scan which was abnormal.   OBJECTIVE:  VITAL SIGNS:  Blood pressure 174/85, respirations 20, pulse 79,  temperature 97.9.  Blood gas showed a pH of 7.38 with pCO2 31.5, pO2 76.7.  CBC:  WBC 6400 with hemoglobin 11.6, hematocrit 34.  HEART:  Regular rhythm.  LUNGS:  Diminished breath sounds, occasional rhonchus over lower lung field.  ABDOMEN:  Slightly tender in upper abdomen.   ASSESSMENT:  Patient has known cholelithiasis, right lower lobe pneumonia.  She does have a history of hypothyroidism.   PLAN:  Continue IV antibiotics.  Continue IV Dilaudid.  Patient will need  cholecystectomy when pneumonia improves.      Angus G. Renard Matter, MD  Electronically Signed     AGM/MEDQ  D:  12/03/2005  T:  12/04/2005  Job:  [id number]

## 2010-12-09 NOTE — Consult Note (Signed)
NAME:  [name], [name]                  ACCOUNT NO.:  [id number]   MEDICAL RECORD NO.:  [id number]          PATIENT TYPE:  INP   LOCATION:  A201                          FACILITY:  [facility]   PHYSICIAN:  J. Darreld Mclean, M.D. DATE OF BIRTH:  1940/08/11   DATE OF CONSULTATION:  DATE OF DISCHARGE:                                   CONSULTATION   REASON FOR CONSULTATION:  Fractured pelvis, superior ramus on the right.   HISTORY:  The patient is a 70 year old female who fell and sustained an  injury to her right hip.  X-rays showed a nondisplaced fracture of the right  superior ramus __________.  No other injuries.  The patient is tender in  this area.  She is difficult to get a good history from.  She is slightly  confused this morning.  I reviewed the chart and have seen the x-rays.   IMPRESSION:  Fracture right pubic ramus.   PLAN:  Bedrest.  Get her up with physical therapy with a walker as the pain  decreases.  Continue analgesia.  I would like to see her in the office in  approximately 10 days to two weeks after discharge.      JWK/MEDQ  D:  05/05/2004  T:  05/05/2004  Job:  [id number]

## 2010-12-09 NOTE — Group Therapy Note (Signed)
NAME:  [name], [name]                  ACCOUNT NO.:  [id number]   MEDICAL RECORD NO.:  [id number]          PATIENT TYPE:  INP   LOCATION:  A313                          FACILITY:  [facility]   PHYSICIAN:  Angus G. Renard Matter, MD   DATE OF BIRTH:  1941-04-28   DATE OF PROCEDURE:  DATE OF DISCHARGE:                                   PROGRESS NOTE   This patient has had abdominal pain radiating to the right upper quadrant.  She does have gallstones by CT and thickening of sigmoid colon.  She has  been seen by gastroenterology service.  HIDA scan has been ordered to  cholecystitis.   OBJECTIVE:  VITAL SIGNS:  Blood pressure 143/79, respirations 20, pulse 74,  temp 97.6.  HEART:  Regular rhythm.  LUNGS:  Diminished breath sounds.  ABDOMEN:  Tenderness in the mid abdomen.   LABORATORY DATA:  CBC:  WBC 12,600 with hemoglobin 12.1, hematocrit 35.  The  patient had low potassium and received three runs of KCl.   ASSESSMENT:  1.  The patient has known cholelithiasis, possible cholecystitis.  2.  She does have thickening of the sigmoid colon as well.  3.  The patient has known hypothyroidism.  4.  History of hypertension.   PLAN:  1.  Proceed with HIDA scan today to rule out cholecystitis.  2.  Continue her regimen.      Angus G. Renard Matter, MD  Electronically Signed     AGM/MEDQ  D:  12/01/2005  T:  12/01/2005  Job:  [id number]

## 2010-12-09 NOTE — Op Note (Signed)
NAME:  [name], [name]                  ACCOUNT NO.:  [id number]   MEDICAL RECORD NO.:  [id number]          PATIENT TYPE:  INP   LOCATION:  A201                          FACILITY:  [facility]   PHYSICIAN:  Dalia Heading, M.D.  DATE OF BIRTH:  [birth year]   DATE OF PROCEDURE:  12/19/2005  DATE OF DISCHARGE:                                 OPERATIVE REPORT   PREOPERATIVE DIAGNOSIS:  Cholecystitis, cholelithiasis.   POSTOPERATIVE DIAGNOSIS:  Cholecystitis, cholelithiasis.   PROCEDURE:  Laparoscopic cholecystectomy.   SURGEON:  Dr. Franky Macho   ANESTHESIA:  General endotracheal.   INDICATIONS:  The patient is a 70 year old white female who presents with  biliary colic secondary to cholelithiasis.  Risks and benefits of the  procedure including bleeding, infection, hepatobiliary injury, and the  possibly of an open procedure were fully explained to the patient, who gave  informed consent.   PROCEDURE NOTE:  The patient was placed in supine position.  After induction  of general endotracheal anesthesia, the abdomen was prepped and draped in  the usual sterile technique with Betadine.  Surgical site confirmation was  performed.   An infraumbilical incision was made down to the fascia.  Veress needle was  introduced into the abdominal cavity and confirmation of placement was done  using the saline drop test.  The abdomen was then insufflated to 16 mmHg  pressure.  A 11-mm trocar was introduced into the abdominal cavity under  direct visualization without difficulty.  The patient was placed in reversed  Trendelenburg position.  Additional 11-mm trocar was placed in the  epigastric region and 5-mm trocars were placed in the right upper quadrant  and right flank regions.  Liver was inspected and noted within normal  limits.  The gallbladder was retracted superior and laterally.  Dissection  was begun around the infundibulum of the gallbladder.  Cystic duct was first  identified.  Its junction  to the infundibulum fully identified.  EndoClips  were placed proximally and distally on the cystic duct and cystic duct was  divided.  This was likewise done to the cystic artery.  The gallbladder then  freed away from the gallbladder fossa using Bovie electrocautery.  The  gallbladder was delivered through the epigastric trocar site using EndoCatch  bag.  Of note was the fact that multiple adhesions were noted around the  gallbladder and these were freed away sharply.  The gallbladder fossa was  inspected.  No abnormal bleeding or bile leakage was noted.  Surgicel was  placed in the gallbladder fossa.  All fluid and air were then evacuated from  the abdominal cavity prior to removal of the trocars.   All wounds irrigated with normal saline.  All wounds were injected with 0.5  cm Sensorcaine.  The infraumbilical fascia as well as epigastric fascia were  reapproximated using 0 Vicryl interrupted sutures.  All skin incisions were  closed using staples.  Betadine ointment and dry sterile dressings were  applied.   All tape and needle counts correct at end of the procedure.  The patient was  extubated in the  operating room went back to recovery room awake in stable  condition.   COMPLICATIONS:  None.   SPECIMEN:  Gallbladder with stones.   BLOOD LOSS:  Minimal.      Dalia Heading, M.D.  Electronically Signed     MAJ/MEDQ  D:  12/19/2005  T:  12/19/2005  Job:  [id number]   cc:   Angus G. Renard Matter, MD  Fax: [phone]

## 2010-12-09 NOTE — Group Therapy Note (Signed)
NAME:  [name], [name]                  ACCOUNT NO.:  [id number]   MEDICAL RECORD NO.:  [id number]          PATIENT TYPE:  INP   LOCATION:  A201                          FACILITY:  [facility]   PHYSICIAN:  Mila Homer. Sudie Bailey, M.D.DATE OF BIRTH:  [birth year]   DATE OF PROCEDURE:  DATE OF DISCHARGE:                                   PROGRESS NOTE   SUBJECTIVE:  The patient says she feels better today and is having less  pain.   OBJECTIVE:  She is sitting up in a chair.  Her speech is still somewhat  thickened and halting.  She has coarse facial features.  Temperature 97.6  degrees, pulse 60, respiratory rate 18, blood pressure 110/79.  Blood test  today showed a MET7 with a sodium of 139, potassium 3.1, chloride 90 and CO2  35.  Yesterday the sodium was 131 and potassium 3.1.  Her CPK two days ago  was 4205.  It dropped to 3559.  Her TSH was 304 __________ 246.  D-dimer  2.54.   ASSESSMENT:  1.  Rhabdomyolysis secondary to being on the floor for a sustained period of      time.  2.  Anemia (hemoglobin 11, hematocrit 31.80.  3.  Hypokalemia.  4.  Hyponatremia.  5.  Hypothyroidism.   PLAN:  Start Synthroid 25 mcg daily.  Eventually she will need a good deal  more of this, but given her age, would start slowly.  Continue with the Kay-  Ciel 20 mEq daily, which was just started yesterday.  She is on metoprolol  25 mg b.i.d. and p.r.n. hydrocodone APAP.  She will be up in a chair today.  Possibly will be able to go home tomorrow.   The time spent with the patient today reviewing her electrolytes, medical  record, talking to the patient, examining the patient, formulating a plan  and dictating a note was 25 minutes.      SDK/MEDQ  D:  05/07/2004  T:  05/07/2004  Job:  [id number]

## 2010-12-09 NOTE — Op Note (Signed)
NAME:  [name], [name]                  ACCOUNT NO.:  [id number]   MEDICAL RECORD NO.:  [id number]          PATIENT TYPE:  INP   LOCATION:  A201                          FACILITY:  [facility]   PHYSICIAN:  Lionel December, M.D.    DATE OF BIRTH:  10-30-1940   DATE OF PROCEDURE:  12/17/2005  DATE OF DISCHARGE:                                 OPERATIVE REPORT   PROCEDURE:  Esophagogastroduodenoscopy with esophageal dilation followed by  colonoscopy.   INDICATION:  [name] is a 70 year old Caucasian female with multiple medical  problems who was hospitalized about 2 weeks ago with pneumonia.  At that  time we felt she needed cholecystectomy which was delayed because of her  respiratory problems.  She had abdominopelvic CT which revealed thickened  sigmoid colon.  She is now back with diminish intake, renal failure which  has essentially corrected with hydration, and is being evaluated by Dr.  Kristian [name].  She is complaining of dysphagia, epigastric pain.  She is  undergoing diagnostic EGD and she is also prepped for colonoscopy to make  sure she does not have sigmoid colon neoplasm.  Procedure risks were  reviewed with the patient and her daughter, informed consent was obtained  from her daughter Ms. [name], who is the power of attorney.   MEDS FOR CONSCIOUS SEDATION:  Benzocaine spray for pharyngeal topical  anesthesia, Demerol 25 mg IV, Versed 5 mg IV.   FINDINGS:  Procedures performed in endoscopy suite.  The patient's vital  signs and O2 sat were monitored during procedure remained stable.  The  patient was placed left lateral position and endoscope was passed oropharynx  without any difficulty into esophagus.   Esophagus.  Mucosa of the esophagus was normal.  Noncritical ring with was  noted at GE junction which was at 35 cm and hiatus was at 37.   Stomach was empty and distended very well with insufflation.  Folds of  proximal stomach were normal.  Examination mucosa revealed patchy  erythema  and edema at antrum along with one large antral and two small prepyloric  scars.  Pyloric channel was deformed but patent.  Angularis, fundus and  cardia examined by retroflexing scope and were normal.   Duodenum.  Bulbar mucosa was normal.  Scope was passed to second part of  duodenum where mucosa and folds were unremarkable.  Pictures taken for the  record.  Endoscope was withdrawn.   Esophagus was dilated by passing 54 and 56-French Maloney dilators to full  insertion.  Esophagus was reexamined post dilation and there was no mucosal  disruption.  Endoscope was withdrawn and the patient prepared for procedure  #2.   COLONOSCOPY:  Rectal examination performed.  Rectal tone was somewhat  diminished.  Olympus videoscope was placed rectum and advanced under vision  into sigmoid colon.  Preparation was satisfactory.  Scope was passed into  cecum which was identified by ileocecal valve and appendiceal orifice.  Pictures taken for the record.  As the scope was withdrawn, colonic mucosa  was carefully examined.  There were a few tiny diverticula at sigmoid  colon  but there was no mass or changes of colitis.  Rectal mucosa similarly was  normal.  Scope was retroflexed to examine anorectal junction which was  unremarkable.  Endoscope was straightened and withdrawn.  The patient  tolerated the procedure well.   FINAL DIAGNOSIS:  Noncritical distal esophageal ring which was dilated by  passing 54 and 56-French Maloney dilators.   Small sliding hiatal hernia.   Antral gastritis with three scars and deformed but patent pylorus indicative  of prior peptic ulcer disease.  Suspect H pylori gastritis.   Few diverticula at sigmoid colon, otherwise normal colonoscopy.   RECOMMENDATIONS:  Will continue her PPI.  H. pylori serology.  Cholecystectomy per Dr. Lovell [name].      Lionel December, M.D.  Electronically Signed     NR/MEDQ  D:  12/17/2005  T:  12/18/2005  Job:  [id number]   cc:    Angus G. Renard Matter, MD  Fax: [phone]

## 2010-12-09 NOTE — Group Therapy Note (Signed)
NAME:  [name], [name]                  ACCOUNT NO.:  [id number]   MEDICAL RECORD NO.:  [id number]          PATIENT TYPE:  INP   LOCATION:  A201                          FACILITY:  [facility]   PHYSICIAN:  Angus G. Renard Matter, MD   DATE OF BIRTH:  02-14-1941   DATE OF PROCEDURE:  12/14/2005  DATE OF DISCHARGE:                                   PROGRESS NOTE   This patient has history of bronchopneumonia and cholelithiasis.  She had  been previously hospitalized for this problem and had been at home with home  nursing care attempting to completely get over her pneumonia.  She had to be  readmitted because of increasing weakness.  She was noted to have markedly  elevated creatinine on admission.  Creatinine was 6.2, BUN 84, sodium 129,  potassium 4.8.  Patient is known to be hypothyroid and has been treated for  hypothyroidism in the past with current TSH 27.519.   OBJECTIVE:  VITAL SIGNS:  Blood pressure 99/41, respirations 20, pulse 70,  temperature 97.2.  HEART:  Regular rhythm.  LUNGS:  Diminished breath sounds.  ABDOMEN:  Tenderness of upper abdomen.   ASSESSMENT:  1.  Patient has acute renal failure, probable dehydration.  2.  Cholelithiasis.  3.  Leukocytosis.  4.  Hypothyroidism.   PLAN:  Reinstitute Synthroid.  Continue IV Zosyn.  Continue rehydration.  Will obtain renal consult with Dr. Kristian Covey.  Repeat BMET, CBC.      Angus G. Renard Matter, MD  Electronically Signed     AGM/MEDQ  D:  12/14/2005  T:  12/14/2005  Job:  [id number]

## 2010-12-09 NOTE — Group Therapy Note (Signed)
NAME:  Whitmyer, Kaneesha                  ACCOUNT NO.:  [id number]   MEDICAL RECORD NO.:  [id number]          PATIENT TYPE:  INP   LOCATION:  201                           FACILITY:  [facility]   PHYSICIAN:  Kofi A. Gerilyn Pilgrim, M.D. DATE OF BIRTH:  [birth year]   DATE OF PROCEDURE:  12/21/2005  DATE OF DISCHARGE:                                   PROGRESS NOTE   The patient's daughter is around and was able to get a better history.  It  appears that the patient has had the neck hyperextension and fluttering  closing eye movements before surgery.  It probably was present about three  or four days before she had her procedure done.  It, however, had improved  spontaneously.  The patient's medications were reviewed in great detail.  It  appears that she was not started on Lexapro after admission and this was  therefore abruptly discontinued.  Today she continues to have hypertension  and hyperextension of the neck with eye closing movement.  The blinking eye  closing movement seems improved over yesterday, however.  She is afebrile.   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  The Lexapro will be restarted and  Benadryl will be increased to 50 mg every six hours.  Some of her other  medications have been arranged to be discontinued.  Will see how she does  with this additionally.      Kofi A. Gerilyn Pilgrim, M.D.  Electronically Signed     KAD/MEDQ  D:  12/21/2005  T:  12/21/2005  Job:  [id number]

## 2010-12-16 ENCOUNTER — Other Ambulatory Visit (HOSPITAL_COMMUNITY): Payer: Self-pay | Admitting: Family Medicine

## 2010-12-16 ENCOUNTER — Ambulatory Visit (HOSPITAL_COMMUNITY)
Admission: RE | Admit: 2010-12-16 | Discharge: 2010-12-16 | Disposition: A | Discharge status: Home / Self Care | Payer: Medicare Other | Source: Ambulatory Visit | Attending: Family Medicine | Admitting: Family Medicine

## 2010-12-16 DIAGNOSIS — R059 Cough, unspecified: Secondary | ICD-10-CM | POA: Insufficient documentation

## 2010-12-16 DIAGNOSIS — Z87891 Personal history of nicotine dependence: Secondary | ICD-10-CM

## 2010-12-16 DIAGNOSIS — J4489 Other specified chronic obstructive pulmonary disease: Secondary | ICD-10-CM | POA: Insufficient documentation

## 2010-12-16 DIAGNOSIS — R05 Cough: Secondary | ICD-10-CM

## 2010-12-16 DIAGNOSIS — J449 Chronic obstructive pulmonary disease, unspecified: Secondary | ICD-10-CM | POA: Insufficient documentation

## 2010-12-26 ENCOUNTER — Inpatient Hospital Stay (HOSPITAL_COMMUNITY)
Admission: EM | Admit: 2010-12-26 | Discharge: 2010-12-30 | DRG: 641 | Disposition: A | Discharge status: Home / Self Care | Payer: Medicare Other | Source: Ambulatory Visit | Attending: Family Medicine | Admitting: Family Medicine

## 2010-12-26 ENCOUNTER — Emergency Department (HOSPITAL_COMMUNITY): Payer: Medicare Other

## 2010-12-26 DIAGNOSIS — N289 Disorder of kidney and ureter, unspecified: Secondary | ICD-10-CM | POA: Diagnosis present

## 2010-12-26 DIAGNOSIS — E039 Hypothyroidism, unspecified: Secondary | ICD-10-CM | POA: Diagnosis present

## 2010-12-26 DIAGNOSIS — F3289 Other specified depressive episodes: Secondary | ICD-10-CM | POA: Diagnosis present

## 2010-12-26 DIAGNOSIS — Z8711 Personal history of peptic ulcer disease: Secondary | ICD-10-CM

## 2010-12-26 DIAGNOSIS — L03211 Cellulitis of face: Secondary | ICD-10-CM | POA: Diagnosis present

## 2010-12-26 DIAGNOSIS — L0201 Cutaneous abscess of face: Secondary | ICD-10-CM | POA: Diagnosis present

## 2010-12-26 DIAGNOSIS — E860 Dehydration: Principal | ICD-10-CM | POA: Diagnosis present

## 2010-12-26 DIAGNOSIS — D509 Iron deficiency anemia, unspecified: Secondary | ICD-10-CM | POA: Diagnosis present

## 2010-12-26 DIAGNOSIS — E785 Hyperlipidemia, unspecified: Secondary | ICD-10-CM | POA: Diagnosis present

## 2010-12-26 DIAGNOSIS — F329 Major depressive disorder, single episode, unspecified: Secondary | ICD-10-CM | POA: Diagnosis present

## 2010-12-26 LAB — BASIC METABOLIC PANEL
BUN: 72 mg/dL — ABNORMAL HIGH (ref 6–23)
CO2: 18 mEq/L — ABNORMAL LOW (ref 19–32)
Calcium: 9.6 mg/dL (ref 8.4–10.5)
Creatinine, Ser: 2.23 mg/dL — ABNORMAL HIGH (ref 0.4–1.2)
Glucose, Bld: 100 mg/dL — ABNORMAL HIGH (ref 70–99)

## 2010-12-26 LAB — URINALYSIS, ROUTINE W REFLEX MICROSCOPIC
Leukocytes, UA: NEGATIVE
Specific Gravity, Urine: 1.025 (ref 1.005–1.030)
Urobilinogen, UA: 0.2 mg/dL (ref 0.0–1.0)

## 2010-12-26 LAB — CBC
HCT: 31.9 % — ABNORMAL LOW (ref 36.0–46.0)
Hemoglobin: 10.5 g/dL — ABNORMAL LOW (ref 12.0–15.0)
RBC: 3.48 MIL/uL — ABNORMAL LOW (ref 3.87–5.11)
RDW: 15.1 % (ref 11.5–15.5)

## 2010-12-26 LAB — CK TOTAL AND CKMB (NOT AT ARMC)
CK, MB: 9.2 ng/mL (ref 0.3–4.0)
Relative Index: 3.1 — ABNORMAL HIGH (ref 0.0–2.5)
Total CK: 300 U/L — ABNORMAL HIGH (ref 7–177)

## 2010-12-26 LAB — URINE MICROSCOPIC-ADD ON

## 2010-12-26 LAB — TROPONIN I: Troponin I: <0.3 ng/mL (ref <0.30)

## 2010-12-27 LAB — BASIC METABOLIC PANEL
Calcium: 8.1 mg/dL — ABNORMAL LOW (ref 8.4–10.5)
GFR calc Af Amer: 30 mL/min — ABNORMAL LOW (ref >60)
GFR calc non Af Amer: 25 mL/min — ABNORMAL LOW (ref >60)
Sodium: 139 mEq/L (ref 135–145)

## 2010-12-27 LAB — MRSA PCR SCREENING: MRSA by PCR: NEGATIVE

## 2010-12-27 LAB — CBC
MCHC: 32.4 g/dL (ref 30.0–36.0)
RDW: 15.1 % (ref 11.5–15.5)

## 2010-12-28 DIAGNOSIS — D649 Anemia, unspecified: Secondary | ICD-10-CM

## 2010-12-28 DIAGNOSIS — R195 Other fecal abnormalities: Secondary | ICD-10-CM

## 2010-12-28 LAB — BASIC METABOLIC PANEL
GFR calc Af Amer: 44 mL/min — ABNORMAL LOW (ref >60)
GFR calc non Af Amer: 36 mL/min — ABNORMAL LOW (ref >60)
Glucose, Bld: 94 mg/dL (ref 70–99)
Potassium: 4.2 mEq/L (ref 3.5–5.1)
Sodium: 139 mEq/L (ref 135–145)

## 2010-12-28 LAB — VITAMIN B12: Vitamin B-12: 307 pg/mL (ref 211–911)

## 2010-12-28 LAB — URINE CULTURE
Colony Count: NO GROWTH
Culture  Setup Time: 201206052003

## 2010-12-28 LAB — DIFFERENTIAL
Basophils Absolute: 0 10*3/uL (ref 0.0–0.1)
Basophils Relative: 0 % (ref 0–1)
Eosinophils Relative: 3 % (ref 0–5)
Lymphocytes Relative: 15 % (ref 12–46)
Neutro Abs: 4.3 10*3/uL (ref 1.7–7.7)

## 2010-12-28 LAB — CBC
HCT: 29.1 % — ABNORMAL LOW (ref 36.0–46.0)
Hemoglobin: 9.1 g/dL — ABNORMAL LOW (ref 12.0–15.0)
RDW: 15.3 % (ref 11.5–15.5)
WBC: 6.1 10*3/uL (ref 4.0–10.5)

## 2010-12-28 LAB — IRON AND TIBC
Iron: 32 ug/dL — ABNORMAL LOW (ref 42–135)
UIBC: 143 ug/dL

## 2010-12-28 LAB — FOLATE: Folate: 2.8 ng/mL — ABNORMAL LOW

## 2010-12-28 LAB — TSH: TSH: 1.763 u[IU]/mL (ref 0.350–4.500)

## 2010-12-28 LAB — T4, FREE: Free T4: 1.21 ng/dL (ref 0.80–1.80)

## 2010-12-28 LAB — FERRITIN: Ferritin: 104 ng/mL (ref 10–291)

## 2010-12-31 ENCOUNTER — Inpatient Hospital Stay (HOSPITAL_COMMUNITY)
Admission: EM | Admit: 2010-12-31 | Discharge: 2011-01-06 | DRG: 194 | Disposition: A | Discharge status: Skilled Nursing Facility | Payer: Medicare Other | Source: Ambulatory Visit | Attending: Family Medicine | Admitting: Family Medicine

## 2010-12-31 ENCOUNTER — Emergency Department (HOSPITAL_COMMUNITY): Payer: Medicare Other

## 2010-12-31 DIAGNOSIS — D509 Iron deficiency anemia, unspecified: Secondary | ICD-10-CM | POA: Diagnosis present

## 2010-12-31 DIAGNOSIS — K449 Diaphragmatic hernia without obstruction or gangrene: Secondary | ICD-10-CM | POA: Diagnosis present

## 2010-12-31 DIAGNOSIS — J189 Pneumonia, unspecified organism: Principal | ICD-10-CM | POA: Diagnosis present

## 2010-12-31 DIAGNOSIS — E785 Hyperlipidemia, unspecified: Secondary | ICD-10-CM | POA: Diagnosis present

## 2010-12-31 DIAGNOSIS — I9589 Other hypotension: Secondary | ICD-10-CM | POA: Diagnosis present

## 2010-12-31 DIAGNOSIS — E039 Hypothyroidism, unspecified: Secondary | ICD-10-CM | POA: Diagnosis present

## 2010-12-31 DIAGNOSIS — J449 Chronic obstructive pulmonary disease, unspecified: Secondary | ICD-10-CM | POA: Diagnosis present

## 2010-12-31 DIAGNOSIS — K5730 Diverticulosis of large intestine without perforation or abscess without bleeding: Secondary | ICD-10-CM | POA: Diagnosis present

## 2010-12-31 DIAGNOSIS — K2210 Ulcer of esophagus without bleeding: Secondary | ICD-10-CM | POA: Diagnosis present

## 2010-12-31 DIAGNOSIS — K2100 Gastro-esophageal reflux disease with esophagitis, without bleeding: Secondary | ICD-10-CM | POA: Diagnosis present

## 2010-12-31 DIAGNOSIS — J4489 Other specified chronic obstructive pulmonary disease: Secondary | ICD-10-CM | POA: Diagnosis present

## 2010-12-31 LAB — BASIC METABOLIC PANEL
BUN: 26 mg/dL — ABNORMAL HIGH (ref 6–23)
CO2: 27 mEq/L (ref 19–32)
Calcium: 8.7 mg/dL (ref 8.4–10.5)
GFR calc non Af Amer: 30 mL/min — ABNORMAL LOW (ref >60)
Glucose, Bld: 90 mg/dL (ref 70–99)
Sodium: 138 mEq/L (ref 135–145)

## 2010-12-31 LAB — URINE MICROSCOPIC-ADD ON

## 2010-12-31 LAB — DIFFERENTIAL
Lymphocytes Relative: 15 % (ref 12–46)
Lymphs Abs: 0.7 10*3/uL (ref 0.7–4.0)
Monocytes Absolute: 0.5 10*3/uL (ref 0.1–1.0)
Monocytes Relative: 10 % (ref 3–12)
Neutro Abs: 3.4 10*3/uL (ref 1.7–7.7)
Neutrophils Relative %: 70 % (ref 43–77)

## 2010-12-31 LAB — URINALYSIS, ROUTINE W REFLEX MICROSCOPIC
Ketones, ur: NEGATIVE mg/dL
Leukocytes, UA: NEGATIVE
Nitrite: NEGATIVE
Protein, ur: NEGATIVE mg/dL
pH: 5.5 (ref 5.0–8.0)

## 2010-12-31 LAB — CBC
HCT: 26.9 % — ABNORMAL LOW (ref 36.0–46.0)
Hemoglobin: 8.9 g/dL — ABNORMAL LOW (ref 12.0–15.0)
MCH: 30.5 pg (ref 26.0–34.0)
MCHC: 33.1 g/dL (ref 30.0–36.0)
RBC: 2.92 MIL/uL — ABNORMAL LOW (ref 3.87–5.11)

## 2010-12-31 LAB — PROCALCITONIN: Procalcitonin: <0.1 ng/mL

## 2011-01-01 LAB — CBC
HCT: 26.5 % — ABNORMAL LOW (ref 36.0–46.0)
Hemoglobin: 8.7 g/dL — ABNORMAL LOW (ref 12.0–15.0)
MCH: 30.4 pg (ref 26.0–34.0)
MCV: 92.7 fL (ref 78.0–100.0)
RBC: 2.86 MIL/uL — ABNORMAL LOW (ref 3.87–5.11)
WBC: 5 10*3/uL (ref 4.0–10.5)

## 2011-01-01 LAB — BASIC METABOLIC PANEL
BUN: 33 mg/dL — ABNORMAL HIGH (ref 6–23)
CO2: 26 mEq/L (ref 19–32)
Chloride: 104 mEq/L (ref 96–112)
Creatinine, Ser: 1.48 mg/dL — ABNORMAL HIGH (ref 0.4–1.2)
Glucose, Bld: 82 mg/dL (ref 70–99)

## 2011-01-01 LAB — DIFFERENTIAL
Lymphocytes Relative: 23 % (ref 12–46)
Lymphs Abs: 1.1 10*3/uL (ref 0.7–4.0)
Monocytes Relative: 12 % (ref 3–12)
Neutrophils Relative %: 60 % (ref 43–77)

## 2011-01-01 LAB — POCT OCCULT BLOOD STOOL (DEVICE): Fecal Occult Bld: NEGATIVE

## 2011-01-02 LAB — DIFFERENTIAL
Eosinophils Relative: 7 % — ABNORMAL HIGH (ref 0–5)
Lymphocytes Relative: 19 % (ref 12–46)
Lymphs Abs: 0.9 10*3/uL (ref 0.7–4.0)
Monocytes Absolute: 0.4 10*3/uL (ref 0.1–1.0)
Monocytes Relative: 9 % (ref 3–12)

## 2011-01-02 LAB — BASIC METABOLIC PANEL
BUN: 25 mg/dL — ABNORMAL HIGH (ref 6–23)
Chloride: 103 mEq/L (ref 96–112)
Creatinine, Ser: 1.3 mg/dL — ABNORMAL HIGH (ref 0.4–1.2)
GFR calc Af Amer: 49 mL/min — ABNORMAL LOW (ref >60)

## 2011-01-02 LAB — CBC
HCT: 29.9 % — ABNORMAL LOW (ref 36.0–46.0)
MCH: 30.3 pg (ref 26.0–34.0)
MCV: 93.4 fL (ref 78.0–100.0)
RDW: 14.2 % (ref 11.5–15.5)
WBC: 4.9 10*3/uL (ref 4.0–10.5)

## 2011-01-02 LAB — IRON AND TIBC
Saturation Ratios: 18 % — ABNORMAL LOW (ref 20–55)
TIBC: 220 ug/dL — ABNORMAL LOW (ref 250–470)

## 2011-01-02 LAB — ABO/RH: ABO/RH(D): O POS

## 2011-01-03 ENCOUNTER — Inpatient Hospital Stay (HOSPITAL_COMMUNITY): Payer: Medicare Other

## 2011-01-03 LAB — CBC
HCT: 29.5 % — ABNORMAL LOW (ref 36.0–46.0)
Hemoglobin: 9.5 g/dL — ABNORMAL LOW (ref 12.0–15.0)
MCH: 30.2 pg (ref 26.0–34.0)
MCHC: 32.2 g/dL (ref 30.0–36.0)

## 2011-01-03 LAB — BASIC METABOLIC PANEL
BUN: 20 mg/dL (ref 6–23)
Calcium: 8.4 mg/dL (ref 8.4–10.5)
GFR calc non Af Amer: 46 mL/min — ABNORMAL LOW (ref >60)
Glucose, Bld: 85 mg/dL (ref 70–99)
Potassium: 3.9 mEq/L (ref 3.5–5.1)

## 2011-01-03 LAB — DIFFERENTIAL
Lymphocytes Relative: 18 % (ref 12–46)
Monocytes Absolute: 0.3 10*3/uL (ref 0.1–1.0)
Monocytes Relative: 7 % (ref 3–12)
Neutro Abs: 3.2 10*3/uL (ref 1.7–7.7)

## 2011-01-04 LAB — CBC
HCT: 28.2 % — ABNORMAL LOW (ref 36.0–46.0)
MCH: 29.5 pg (ref 26.0–34.0)
MCHC: 31.9 g/dL (ref 30.0–36.0)
RDW: 14.1 % (ref 11.5–15.5)

## 2011-01-04 LAB — DIFFERENTIAL
Basophils Absolute: 0 10*3/uL (ref 0.0–0.1)
Basophils Relative: 0 % (ref 0–1)
Eosinophils Relative: 7 % — ABNORMAL HIGH (ref 0–5)
Lymphocytes Relative: 21 % (ref 12–46)
Monocytes Absolute: 0.5 10*3/uL (ref 0.1–1.0)

## 2011-01-05 ENCOUNTER — Other Ambulatory Visit: Payer: Self-pay | Admitting: Internal Medicine

## 2011-01-05 DIAGNOSIS — K2980 Duodenitis without bleeding: Secondary | ICD-10-CM

## 2011-01-05 DIAGNOSIS — R195 Other fecal abnormalities: Secondary | ICD-10-CM

## 2011-01-05 DIAGNOSIS — K2100 Gastro-esophageal reflux disease with esophagitis, without bleeding: Secondary | ICD-10-CM

## 2011-01-05 DIAGNOSIS — D649 Anemia, unspecified: Secondary | ICD-10-CM

## 2011-01-05 DIAGNOSIS — K5730 Diverticulosis of large intestine without perforation or abscess without bleeding: Secondary | ICD-10-CM

## 2011-01-06 ENCOUNTER — Inpatient Hospital Stay
Admission: RE | Admit: 2011-01-06 | Discharge: 2011-01-26 | Disposition: A | Discharge status: Home / Self Care | Payer: Medicare Other | Source: Ambulatory Visit | Attending: Family Medicine | Admitting: Family Medicine

## 2011-01-06 LAB — CROSSMATCH

## 2011-01-11 NOTE — H&P (Signed)
  NAME:  [name], [name]                  ACCOUNT NO.:  [id number]  MEDICAL RECORD NO.:  [id number]  LOCATION:                                 FACILITY:  PHYSICIAN:  Marnell Mcdaniel G. Renard Matter, MD   DATE OF BIRTH:  [birth year]  DATE OF ADMISSION: DATE OF DISCHARGE:  LH                             HISTORY & PHYSICAL   HISTORY OF PRESENT ILLNESS:  This 71 year old white female was admitted to the hospital with weakness and dehydration.  She apparently had 2 days of feeling weak and with decreased appetite.  She was evaluated by ED physician initially.  Lab studies on admission showed a hemoglobin of 10.5, hematocrit 31.9, and BMET showed elevated creatinine at 2.23, BUN 72, CK was 300, relative index 3.1, troponin less than 0.30.  The patient was started on IV fluids and then subsequently admitted.  SOCIAL HISTORY:  The patient was a former smoker.  Does not use drugs or drink alcohol.  PAST MEDICAL HISTORY:  Hypertension, hypothyroidism, peptic ulcer disease, peripheral edema.  FAMILY HISTORY:  Unknown.  MEDICATION LIST: 1. Benicar 20 mg daily. 2. Crestor 20 mg daily. 3. Hydromet 1 teaspoon p.r.n. 4. Levothyroxine 75 mcg daily.  REVIEW OF SYSTEMS:  HEENT:  Negative.  CARDIOPULMONARY:  Patient had occasional cough and dyspnea.  GI:  No bowel irregularity or melena.  No nausea, vomiting or diarrhea.  GU:  No dysuria, hematuria.  PHYSICAL EXAMINATION:  GENERAL/VITAL SIGNS:  Alert female with blood pressure 102/64, respirations 20, pulse 106, temperature 98.3. HEENT:  Eyes: PERRLA.  TMs negative.  The patient has multiple dental caries and some denture in front teeth. NECK:  Supple.  No JVD or thyroid abnormalities. HEART:  Regular rate and rhythm.  No murmurs.  No cardiomegaly. LUNGS:  Clear to P and A. ABDOMEN:  No palpable organs or masses.  No organomegaly.  ASSESSMENT:  The patient was admitted with dehydration.  She does have prior history of hypothyroidism and elevated creatinine,  to admit for hydration and further evaluation.     Anna Beaird G. Renard Matter, MD     AGM/MEDQ  D:  12/30/2010  T:  12/30/2010  Job:  [id number]  Electronically Signed by Butch Penny MD on 01/11/2011 07:03:21 AM

## 2011-01-11 NOTE — Group Therapy Note (Signed)
  NAME:  [name], [name]                  ACCOUNT NO.:  [id number]  MEDICAL RECORD NO.:  [id number]  LOCATION:                                 FACILITY:  PHYSICIAN:  Alphons Burgert G. Renard Matter, MD   DATE OF BIRTH:  1941/01/07  DATE OF PROCEDURE: DATE OF DISCHARGE:                                PROGRESS NOTE   This patient was admitted through the night with dehydration.  She has been hydrated.  She does have a longstanding history of hypothyroidism.  PHYSICAL EXAMINATION:  VITAL SIGNS:  Blood pressure 121/86, pulse rate 92, respiration 19. LUNGS:  Clear. HEART:  Regular rhythm. ABDOMEN:  No palpable organs or masses.  Repeat hemoglobin and hematocrit shows a hemoglobin of 8.9.  Repeat chemistries within normal range.  ASSESSMENT:  The patient does have hypothyroidism and anemia.  She was admitted with dehydration and is being rehydrated.  We will repeat CBC and chemistries in a.m.     [name] G. Renard Matter, MD     AGM/MEDQ  D:  12/27/2010  T:  12/28/2010  Job:  [id number]  Electronically Signed by Butch Penny MD on 01/11/2011 07:03:25 AM

## 2011-01-11 NOTE — Group Therapy Note (Signed)
  NAME:  [name], [name]                  ACCOUNT NO.:  [id number]  MEDICAL RECORD NO.:  [id number]  LOCATION:  A304                          FACILITY:  [facility]  PHYSICIAN:  Mylissa Lambe G. Renard Matter, MD   DATE OF BIRTH:  10-Jan-1941  DATE OF PROCEDURE:  12/30/2010 DATE OF DISCHARGE:  12/30/2010                                PROGRESS NOTE   SUBJECTIVE:  This patient has been hydrated per initial admission and remains on IV fluids.  She has a longstanding history of hypothyroidism, also was found to be markedly anemic.  A repeat hemoglobin was 9.9 with hematocrit 29.6.  Anemia panel completed showed a low serum iron level of 32.  She was seen in consultation by GI service who felt that further workup could be done as an outpatient as long as she had no further drop in her hemoglobin.  OBJECTIVE:  VITAL SIGNS:  Blood pressure 90/60, pulse rate 92, respirations 18, and temperature 99.6. HEART:  Regular rhythm. LUNGS:  Diminished breath sounds bilaterally. ABDOMEN:  No palpable organs or masses.  ASSESSMENT:  The patient does have ongoing problems of hypothyroidism, chronic anemia which seems to be iron deficiency type anemia.  She does have dementia and was admitted with dehydration which is improved.  PLAN:  Continue current regimen.     Derreon Consalvo G. Renard Matter, MD     AGM/MEDQ  D:  12/30/2010  T:  12/30/2010  Job:  [id number]  Electronically Signed by Butch Penny MD on 01/11/2011 07:03:31 AM

## 2011-01-11 NOTE — Group Therapy Note (Signed)
  NAME:  [name], KLINGE                  ACCOUNT NO.:  [id number]  MEDICAL RECORD NO.:  [id number]  LOCATION:                                 FACILITY:  PHYSICIAN:  Daimen Shovlin G. Renard Matter, MD   DATE OF BIRTH:  [birth year]  DATE OF PROCEDURE:  12/29/2010 DATE OF DISCHARGE:                                PROGRESS NOTE   SUBJECTIVE:  This patient was admitted with dehydration.  She had been receiving intravenous fluids.  She does have a long history of hypothyroidism and was noted to be anemic with a hemoglobin of 8.9, hematocrit 27.5, and repeat CBC showed a hemoglobin of 9.1 and hematocrit 29.1.  Anemia panel was done which shows a low serum iron of 32 and iron-binding capacity of 175.  OBJECTIVE:  VITAL SIGNS:  Blood pressure 92/59, respirations 18, pulse 85, and temperature 98.7. LUNGS:  Diminished breath sounds.  Rhonchi bilaterally. HEART:  Regular rhythm. ABDOMEN:  No palpable organs or masses.  ASSESSMENT:  The patient does have hypothyroidism, is somewhat demented. She was admitted with weakness and dehydration.  She does have what appears to be iron deficiency anemia.  PLAN:  Monitor stool for occult blood.  We will obtain GI consult. Start oral iron.     Mehtaab Mayeda G. Renard Matter, MD     AGM/MEDQ  D:  12/29/2010  T:  12/29/2010  Job:  [id number]  Electronically Signed by Butch Penny MD on 01/11/2011 07:03:29 AM

## 2011-01-11 NOTE — Discharge Summary (Signed)
NAME:  [name], [name]                  ACCOUNT NO.:  [id number]  MEDICAL RECORD NO.:  [id number]  LOCATION:  A308                          FACILITY:  [facility]  PHYSICIAN:  Sweet Jarvis G. Renard Matter, MD   DATE OF BIRTH:  11/19/1940  DATE OF ADMISSION:  12/31/2010 DATE OF DISCHARGE:  06/15/2012LH                              DISCHARGE SUMMARY   This 70 year old white female was admitted on December 31, 2010, discharged January 06, 2011, 6 days' hospitalization.  DIAGNOSES:  Hypotension, anemia felt to be iron deficiency anemia, hospital-acquired pneumonia, hypothyroidism, hyperlipidemia, chronic obstructive pulmonary disease.  The patient's condition stable and improved at the time of her discharge.  This 70 year old female had recently been discharged from the hospital after having been treated for volume depletion, azotemia, urinary tract infection, and anemia.  She had been seen by Gastroenterology Service.  It was felt that she could have endoscopy asan outpatient for further evaluation of her anemia.  She was brought back by her son because of generalized weakness and inability to move and get out of bed.  She was alert and oriented at the time of admission.  It was noted she had atelectasis versus infiltrate left lower lobe of the lung.  She was placed on dual antibiotics as she had been in the health care facility recently.  She was noted to have a low hemoglobin at the time of admission.  PHYSICAL EXAMINATION:  VITAL SIGNS:  Blood pressure 102/74, respiration 18. HEENT:  Eyes:  PERRLA.  TMs negative.  Oropharynx benign. NECK:  Supple.  No JVD or thyroid abnormalities. HEART:  Regular rhythm. LUNGS:  Prolonged expiratory phase of respiration. ABDOMEN:  No palpable organs or masses. EXTREMITIES:  Showed trace edema.  LABORATORY DATA:  CBC on admission:  WBC 4900 with hemoglobin 8.9, hematocrit 26.9, MCV 92, MCH 30, MCHC 33.1.  UA on admission essentially negative.  Subsequent CBC on June 11,  hemoglobin 9.7, hematocrit 29.9 and on June 13, hemoglobin 9.0, hematocrit 28.2.  Stool for occult blood was negative.  X-RAYS:  Chest x-ray on admission showed left basal opacity compatible with early pneumonia.  Chest x-ray on June 12, stable.  X-ray with slight hyperaeration, small hiatal hernia noted.  HOSPITAL COURSE:  The patient at time of admission was started on intravenous fluids.  She was started on vancomycin and Zosyn combination, Robitussin p.r.n.  Because of her low hemoglobin, she was typed and crossmatched with 2 units of packed RBCs.  Anemia panel showed evidence of low serum iron at 40.  Other tests were essentially normal. The patient remained on vancomycin and Zosyn until June 12 when she was changed over to Levaquin 500 mg daily.  The patient seen in consultation by Gastroenterology Service.  She was set up for endoscopy which had been done on June 14.  It was noted that she had marked inflammatory changes in esophagus consistent with severe erosive ulcerative reflux esophagitis likely contributing to the anemia.  She also had a large hiatal hernia, deformity of antrum.  Colonoscopy findings showed evidence of left-sided diverticula.  Mucosa appeared normal.  Biopsies for H. pylori were done.  The patient tolerated these procedures in  satisfactory manner.  Arrangements for her to be placed in nursing facility were accomplished.  She was discharged after 7 days' hospitalization.  She was in stable condition.  She was discharged on following medications: 1. Levaquin 500 mg daily 2. Protonix 40 mg b.i.d. 3. Crestor 5 mg daily. 4. Levothyroxine 75 mcg daily. 5. Lorazepam 1 mg b.i.d.  The patient's condition was stable at the time of discharge.     Tamarion Haymond G. Renard Matter, MD     AGM/MEDQ  D:  01/06/2011  T:  01/06/2011  Job:  [id number]  Electronically Signed by Butch Penny MD on 01/11/2011 07:04:00 AM

## 2011-01-11 NOTE — Group Therapy Note (Signed)
  NAME:  [name], [name]                  ACCOUNT NO.:  [id number]  MEDICAL RECORD NO.:  [id number]  LOCATION:  A308                          FACILITY:  [facility]  PHYSICIAN:  Ashiya Kinkead G. Renard Matter, MD   DATE OF BIRTH:  [birth year]  DATE OF PROCEDURE: DATE OF DISCHARGE:                                PROGRESS NOTE   This patient was admitted with left lower lobe pneumonia, anemia, weakness, as well as hypothyroidism.  Her current hemoglobin is 9.5, hematocrit is 29.5.  She does have heme-positive stool, appears to be more alert.  OBJECTIVE:  VITAL SIGNS:  Blood pressure 92/62, respirations 20, pulse 77, temp 98.3. LUNGS:  Diminished breath sounds bilaterally. HEART:  Regular rhythm. ABDOMEN:  No palpable organs or masses.  ASSESSMENT:  The patient was admitted with what appeared to be pneumonia left lower lobe and anemia.  She does have heme-positive stool.  PLAN:  To obtain GI consult.  We will continue current regimen.     Humphrey Guerreiro G. Renard Matter, MD     AGM/MEDQ  D:  01/04/2011  T:  01/04/2011  Job:  [id number]  Electronically Signed by Butch Penny MD on 01/11/2011 07:04:08 AM

## 2011-01-11 NOTE — Group Therapy Note (Signed)
  NAME:  [name], [name]                  ACCOUNT NO.:  [id number]  MEDICAL RECORD NO.:  [id number]  LOCATION:  A308                          FACILITY:  [facility]  PHYSICIAN:  Cana Mignano G. Renard Matter, MD   DATE OF BIRTH:  09-10-1940  DATE OF PROCEDURE: DATE OF DISCHARGE:                                PROGRESS NOTE   The patient was admitted with left lower lobe pneumonia, anemia, and weakness as well as hypothyroidism.  She has a low hemoglobin, most recent one was 9, hematocrit of 28.2, and has had a positive stool for blood.  She is being prepped for colonoscopy and endoscopy by Gastroenterology Service.  OBJECTIVE:  VITAL SIGNS:  Blood pressure 148/80, respirations 16, pulse 55, temp 98.1. LUNGS:  Diminished breath sounds. HEART:  Regular rhythm. ABDOMEN:  No palpable organs or masses.  Her current hemoglobin is 9.0 with a hematocrit of 28.2.  ASSESSMENT:  The patient remains on IV antibiotics of vancomycin and Zosyn for treatment of pneumonia which is improving.  She is in the process of being prepped for colonoscopy and endoscopy to further evaluate the gastrointestinal bleed and anemia.     Latajah Thuman G. Renard Matter, MD     AGM/MEDQ  D:  01/05/2011  T:  01/05/2011  Job:  [id number]  Electronically Signed by Butch Penny MD on 01/11/2011 07:04:12 AM

## 2011-01-11 NOTE — Group Therapy Note (Signed)
  NAMESEAIRRA, OTANI                  ACCOUNT NO.:  [id number]  MEDICAL RECORD NO.:  [id number]  LOCATION:                                 FACILITY:  PHYSICIAN:  Devlynn Knoff G. Renard Matter, MD        DATE OF BIRTH:  DATE OF PROCEDURE:  12/28/2010 DATE OF DISCHARGE:                                PROGRESS NOTE   SUBJECTIVE:  This patient was admitted with dehydration.  She is being hydrated.  She does have slight cough and does have history of hypothyroidism and recent notation of low hemoglobin.  Hemoglobin was 8.9, hematocrit 27.5 with MCH 30, and MCHC 32.  She is more alert today.  OBJECTIVE:  VITAL SIGNS:  Blood pressure 106/66, respiration 20, pulse 87, and temperature 97.6. LUNGS:  Occasional rhonchus. HEART:  Regular rhythm. ABDOMEN:  No palpable organs or masses.  ASSESSMENT:  The patient admitted with dehydration.  She does have a history of hypothyroidism as well.  Does have an anemia.  PLAN:  To monitor stools for occult blood.  If positive, will need GI consult or possible endoscopy.  Continue to monitor the patient's hemoglobin and hematocrit.     Akhila Mahnken G. Renard Matter, MD     AGM/MEDQ  D:  12/28/2010  T:  12/28/2010  Job:  [id number]  Electronically Signed by Butch Penny MD on 01/11/2011 07:03:34 AM

## 2011-01-11 NOTE — Discharge Summary (Signed)
NAME:  [name], [name]                  ACCOUNT NO.:  [id number]  MEDICAL RECORD NO.:  [id number]  LOCATION:  A304                          FACILITY:  [facility]  PHYSICIAN:  Naomee Nowland G. Renard Matter, MD   DATE OF BIRTH:  1941-05-04  DATE OF ADMISSION:  12/26/2010 DATE OF DISCHARGE:  06/08/2012LH                              DISCHARGE SUMMARY   HISTORY:  This patient is a 70 year old Caucasian female admitted on December 27, 2010 and discharged on December 30, 2010, three days hospitalization.  DIAGNOSES: 1. Dehydration. 2. Azotemia. 3. Hypothyroidism. 4. Iron deficiency anemia. 5. Depression. 6. Dyslipidemia.  CONDITION:  Stable at the time of discharge.  This patient was admitted with weakness and diminished appetite.  She was felt to be dehydrated on admission with elevation of her BUN and creatinine.  She does have hypothyroidism and anemia and a past history of significant peptic ulcer disease.  ADMISSION PHYSICAL EXAMINATION:  GENERAL:  Alert female with blood pressure 102/64, respirations 20, pulse 106, and temperature 98.3. HEENT:  Eyes:  PERRLA.  TMs negative.  The patient has poor dentition. LUNGS:  Coarse rhonchi. HEART:  Regular rhythm.  No murmurs. ABDOMEN:  No palpable organs or masses.  No organomegaly. EXTREMITIES:  Free of edema.  LABORATORY DATA:  CBC on admission:  WBC 8700 with hemoglobin 10.5, hematocrit 31.9.  UA negative.  BMP:  Sodium 133, potassium 3.9, chloride 103, CO2 18, glucose 100, creatinine 2.23.  CK on admission total of 300, CK-MB 9.1, relative index 3.1, troponin less than 0.30. Subsequent CBC on December 28, 2010 showed hemoglobin 9.1, hematocrit 29.1; December 29, 2010, hemoglobin was 8.4, hematocrit 26.0, and December 30, 2010, hemoglobin 9.19, hematocrit 29.6.  X-RAYS:  Chest x-ray on admission showed no acute cardiopulmonary findings.  HOSPITAL COURSE:  The patient at the time of her admission was placed on normal saline at 125 mL an hour.  Initially, this was decreased  to 100 mL/hour.  She was continued on Tylenol 500 mg every 4 hours p.r.n. for pain and placed on regular diet.  Foley catheter was inserted.  Benicar 20 mg daily was continued.  Levothyroxine 75 mcg p.o. daily was continued along with Crestor 20 mg daily and Hycodan syrup every 4 hours p.r.n. for cough.  The patient had some redness of the distal portion of nose, found to have some cellulitis here, was placed on Keflex 500 mg b.i.d. and this improved.  An anemia panel showed evidence of iron- deficiency anemia.  The patient did show signs of depression during the hospital stay.  She was seen in consultation by GI service because of the low hemoglobin and evidence of iron deficiency and it was recommended that she be followed as an outpatient since previous endoscopy had been done.  She has known peptic ulcer disease in the past.  It was not felt appropriate to proceed with colonoscopy at this time.  It was felt the patient could be discharged on the following medications: 1. Keflex 500 mg b.i.d. 2. Ferrous sulfate 325 mg b.i.d. 3. Crestor 20 mg daily. 4. Hycodan syrup every 6 hours as needed. 5. Levothyroxine 75 mcg daily. 6. Lorazepam 1 mg b.i.d.  The patient was stable at time of discharge.     Jahaziel Francois G. Renard Matter, MD     AGM/MEDQ  D:  12/30/2010  T:  12/30/2010  Job:  [id number]  Electronically Signed by Butch Penny MD on 01/11/2011 07:03:15 AM

## 2011-01-11 NOTE — Group Therapy Note (Signed)
  NAME:  [name], [name]                  ACCOUNT NO.:  [id number]  MEDICAL RECORD NO.:  [id number]  LOCATION:                                 FACILITY:  PHYSICIAN:  Yaniv Lage G. Renard Matter, MD   DATE OF BIRTH:  [birth year]  DATE OF PROCEDURE: DATE OF DISCHARGE:                                PROGRESS NOTE   This patient was admitted with left lower lobe pneumonia, anemia, weakness and hypothyroidism history.  Her hemoglobin now is 9.7 with hematocrit 29.9 and anemia panel showed serum iron level of 40, iron- binding capacity 220.  OBJECTIVE:  VITAL SIGNS:  Blood pressure 130/65, respirations 20, pulse 74, temperature 98.1. LUNGS:  Congestion bilaterally. HEART:  Regular rhythm. ABDOMEN:  No palpable organs or masses.  ASSESSMENT:  The patient was admitted with bronchopneumonia left lower lobe.  She does have anemia and hypothyroidism.  PLAN:  To continue current regimen.  We will continue IV antibiotics.     Hendy Brindle G. Renard Matter, MDAGM/MEDQ  D:  01/03/2011  T:  01/03/2011  Job:  [id number]  Electronically Signed by Butch Penny MD on 01/11/2011 07:04:06 AM

## 2011-01-11 NOTE — Group Therapy Note (Signed)
  NAME:  [name], SUROWIEC                  ACCOUNT NO.:  [id number]  MEDICAL RECORD NO.:  [id number]  LOCATION:  A308                          FACILITY:  [facility]  PHYSICIAN:  Haylei Cobin G. Renard Matter, MD   DATE OF BIRTH:  02/16/1941  DATE OF PROCEDURE:  01/02/2011 DATE OF DISCHARGE:                                PROGRESS NOTE   SUBJECTIVE:  This patient was admitted with the left lower lobe pneumonia, anemia, and weakness as well as hypothyroidism.  She does have a hemoglobin of 8.7.  OBJECTIVE:  VITAL SIGNS:  Blood pressure 115/77, respirations 16, pulse of 80, and temperature 97.1. LUNGS:  Congestion bilaterally. HEART:  Regular rhythm. ABDOMEN:  No palpable organs or masses.  ASSESSMENT:  The patient was admitted with pneumonia, left lower lobe. She does have anemia and hypothyroidism.  PLAN:  To obtain anemia panel.  Type and cross match 2 units of packed RBCs.  Continue current IV antibiotics which is vancomycin and Zosyn.     Aubri Gathright G. Renard Matter, MD     AGM/MEDQ  D:  01/02/2011  T:  01/02/2011  Job:  [id number]  Electronically Signed by Butch Penny MD on 01/11/2011 07:04:03 AM

## 2011-02-06 NOTE — Consult Note (Signed)
NAME:  [name], [name]                  ACCOUNT NO.:  [id number]  MEDICAL RECORD NO.:  [id number]  LOCATION:  A304                          FACILITY:  [facility]  PHYSICIAN:  R. Roetta Sessions, MD FACP FACGDATE OF BIRTH:  [birth year]  DATE OF CONSULTATION:  12/28/2010 DATE OF DISCHARGE:                                CONSULTATION   REASON FOR CONSULTATION:  Anemia.  HISTORY OF PRESENT ILLNESS:  [name] is a pleasantly demented 70 year old Caucasian female, who was admitted on December 27, 2010, with weakness and dehydration.  She states that 2 days prior she was feeling quite weak and had a decreased appetite.  She denies any abdominal pain, nausea, or vomiting.  She denies any dysphagia.  She reports having a bowel movement every other day.  She denies any melena or bright red blood in stools.  She does not take any NSAIDs or aspirin powders. She has a history significant for peptic ulcer disease with her last endoscopy and colonoscopy in 2007 with a noncritical Schatzki's ring, status post dilation as well as peptic ulcer disease.  Colonoscopy showed few diverticula otherwise normal colon.  H pylori serology was positive status post treatment.  Her admitting labs were hemoglobin 10.5, it then dropped to 8.9 and then most recent hemoglobin was 8.4 as of today, December 29, 2010, anemia panel shows low iron, low total iron binding capacity, normal ferritin, appears to be some type of mixed picture of anemia, nonspecific.  As of note, the most recent CBC in the past was from August 2011, which showed hemoglobin of 12.1.  There is a fecal occult blood pending.  PAST MEDICAL HISTORY:  Significant for hypertension, hypothyroidism, peptic ulcer disease as documented on endoscopy in 2007, which was thought to be due to H pylori gastritis, anemia, depression, dyslipidemia.  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  No intervention was needed at that time.  Recommendations were to continue aspirin, this was back in 2008.  PAST SURGICAL HISTORY:  Fracture, ORIF of right distal radius and cholecystectomy.  FAMILY HISTORY:  No history of colon cancer or liver or digestive problems.  SOCIAL HISTORY:  She does not currently smoke; however, she smoked for a total of 3 years two pack per day.  She quit 40 years ago.  She denies alcohol use.  She has two children.  She lives with her son.  She is widowed.  CURRENT MEDICATIONS: 1. Keflex 500 mg b.i.d. 2. Ferrous sulfate 325 mg b.i.d. 3. Synthroid 75 daily. 4. Crestor 20 mg each evening.  ALLERGIES:  She has no known drug allergies.  REVIEW OF SYSTEMS:  Negative in detail except as mentioned in HPI.  As of note, she is pleasantly demented, though obtained history is quite interesting and difficult.  PHYSICAL EXAMINATION:  VITALS:  Blood pressure 114/73, pulse 82, respirations 18, temperature 98.5, 96% on room air. GENERAL:  She is in no apparent distress.  She is alert to person only. She is unsure what year it is, what is present.  She knows  she is in [place] and in the hospital. HEENT:  No thyromegaly noted.  No masses or cervical adenopathy.  She has poor dentition. LUNGS:  Coarse with scattered rhonchi, diminished in the bases.  No wheezing noted. CARDIAC:  S1 and S2 present, no murmurs noted. ABDOMEN:  Soft, nontender, and nondistended.  Positive bowel sounds.  No rebound or guarding.  No hepatomegaly. EXTREMITIES:  Without edema.  She has contractures of her bilateral upper extremities and lower extremities. NEURO:  Pleasantly demented, alert and oriented to person only.  ASSESSMENT:  [name] is a pleasant 70 year old Caucasian female with past history of peptic ulcer disease as well as H. pylori gastritis. She presents with significant anemia, although no signs of her GI bleeding.  Her  last colonoscopy was in 2007 and is very likely that she may end up having a colonoscopy and upper endoscopy in the future.  However, this very well might could be done outpatient as long as she continues to be stable and has no further drop in her hemoglobin.  We will follow up with the hemoglobin status and make further recommendations in the near future.  Continue supportive measures.  We would like to thank Dr. Renard Matter for the referral of this nice lady.    ______________________________ Gerrit Halls, ANP-BC   ______________________________ R. Roetta Sessions, MD [facility]    AS/MEDQ  D:  12/29/2010  T:  12/29/2010  Job:  [id number]  Electronically Signed by Gerrit Halls  on 01/17/2011 02:51:31 PM Electronically Signed by Lorrin Goodell M.D. on 02/06/2011 09:17:50 AM

## 2011-02-06 NOTE — Op Note (Signed)
NAME:  [name], [name]                  ACCOUNT NO.:  [id number]  MEDICAL RECORD NO.:  [id number]  LOCATION:  A308                          FACILITY:  [facility]  PHYSICIAN:  R. Roetta Sessions, MD FACP FACGDATE OF BIRTH:  03-07-1941  DATE OF PROCEDURE: DATE OF DISCHARGE:                              OPERATIVE REPORT   PROCEDURE:  Diagnostic colonoscopy follow up, EGD with biopsy.  INDICATIONS FOR PROCEDURE:  A 70 year old lady with recent pneumonia admitted to hospital with acute and chronic anemia, Hemoccult positive stool.  She is undergoing a colonoscopy, possible EGD to further evaluate her symptoms.  Risks, benefits, limitations, alternatives, imponderables have been discussed, questions answered.  Please see documentation of medical record.  PROCEDURE NOTE:  O2 saturation, blood pressure, pulse respirations monitored throughout the entirety of the procedure.  CONSCIOUS SEDATION:  Versed 4 mg IV, Demerol 50 mg IV in divided doses.  INSTRUMENT:  Pentax video chip system.  COLONOSCOPY FINDINGS:  Digital rectal exam revealed no abnormalities. Endoscopic findings:  Prep was adequate.  Colon:  Colonic mucosa was surveyed from the rectosigmoid junction through the left transverse right colon to the appendiceal orifice, ileocecal valve/cecum.  These structures were well seen and photographed for the record.  From this level, the scope was withdrawn.  All previously mentioned mucosal surfaces were again seen.  The patient was noted to have left-sided diverticula.  Remainder of colonic mucosa appeared normal.  Scope was pulled down to the rectum where a thorough examination of rectal mucosa including retroflexed view of the anal verge demonstrated no abnormalities.  The patient tolerated the procedure well.  Cecal withdrawal time 9 minutes.  The patient was prepared for EGD.  FINDINGS:  Examination of the tubular esophagus revealed four quadrant distal esophageal erosions with small ulcers  at the EG junction and a circumferential distribution.  The erosions extended 7 to 8 cm up into the tubular esophagus from the EG junction of the Barrett's esophagus or other abnormality.  EG junction easily traversed.  Stomach:  Gastric cavity was emptied, insufflated well with air.  A thorough examination of the gastric mucosa including retroflexion in the proximal stomach and esophagogastric junction demonstrated a moderate-sized hiatal hernia and antral scarring and deformity, but there was no evidence of infiltrating process or ulcer.  Pylorus patent, easily traversed and second portion revealed marked erosive duodenitis of the bulb without ulcer or other abnormality observed.  THERAPEUTIC/DIAGNOSTIC MANEUVERS PERFORMED:  Biopsies of the antrum and body were taken for histologic study to rule out H. pylori.  The patient tolerated procedure well.  Cecal withdrawal time is 9 minutes.  IMPRESSION: 1. EGD marked inflammatory change in the esophagus consistent with     severe erosive/ulcerative reflux esophagitis, likely a contributing     component of anemia Hemoccult positive stool. 2. Large hiatal hernia deformity of the antrum marked erosive     duodenitis bulb, status post gastric biopsy.  Colonoscopy findings     normal rectum, left-sided diverticula and colonic mucosa appeared     normal.  RECOMMENDATIONS: 1. B.i.d. proton pump inhibitor therapy. 2. Advance diet. 3. Follow-up biopsies to assess for H. pylori 4. May not need a  capsule study if anemia and Hemoccult positive     status resolves as she could have become anemic and produced     Hemoccult positive stool from her esophagitis. 5. Further recommendations to follow.     Jonathon Bellows, MD FACP [facility]     RMR/MEDQ  D:  01/05/2011  T:  01/05/2011  Job:  [id number]  cc:   Angus G. Renard Matter, MD Fax: [phone]  Electronically Signed by Lorrin Goodell M.D. on 02/06/2011 09:17:47 AM

## 2011-02-06 NOTE — Consult Note (Signed)
NAME:  [name], [name]                  ACCOUNT NO.:  [id number]  MEDICAL RECORD NO.:  [id number]  LOCATION:  A308                          FACILITY:  [facility]  PHYSICIAN:  Gerrit Halls, ANP-BC      DATE OF BIRTH:  [birth year]  DATE OF CONSULTATION: DATE OF DISCHARGE:                                CONSULTATION   ADMITTING PHYSICIAN:  Delbert Harness, M.D.  CONSULTING PHYSICIAN:  R. Roetta Sessions, M.D., Jerrel Ivory, [facility].  REASON FOR CONSULTATION:  Anemia and heme-positive stools.  HISTORY AND PHYSICAL:  [name] is a 70 year old pleasantly confused Caucasian female who is actually seen by [place] in consultation on June 6 secondary to anemia.  [name] did have stable hemoglobin at that time and no signs of any overt GI bleeding.  Therefore, we decided to do any further workup outpatient.  [name] was quite stable at that time; however, [name] was discharged on the 8th and return again on the 9th with weakness. [name] was admitted with left lower lobe pneumonia, anemia, and weakness.    On admission, her hemoglobin was 8.9.  [name] actually received a unit of blood during this admission so far.  Her hemoglobin is stable at night; however, has been drifting down over the past few days from 9.7 to 9.5 and down to 9.  [name] denies any signs of GI bleeding.  [name] denies any melena or bright red blood per rectum.  [name] has a bowel movement every 2-3 days.  [name] denies any nausea or vomiting or dysphasia.  [name] denies taking any NSAIDs or aspirin powders.  [name] does have a history significant for peptic ulcer disease with the last endoscopy and colonoscopy in 2007, showing a noncritical Schatzki's ring, status post dilation, as well as peptic ulcer disease. The colonoscopy showed few diverticula, otherwise normal colon.  [name] did have positive H pylori serology and is status post treatment.  PAST MEDICAL HISTORY:  Hypertension, hypothyroidism, peptic ulcer disease as documented on endoscopy in 2007, H. pylori gastritis,  anemia, depression, dyslipidemia, question of mesenteric ischemia in 2008; however, angiography showed a patent iliac and mild stenosis of the SMA, and significant stenosis of the IMA.  No intervention was needed at this time.  PAST SURGICAL HISTORY:  ORIF of the right distal radius and cholecystectomy.  CURRENT MEDICATIONS:  Upon admission to the hospital were iron 325 mg p.o. b.i.d., Crestor 20 mg daily, Synthroid 75 mcg p.o. daily, lorazepam 1 mg p.o. b.i.d., Keflex 5 mg p.o. b.i.d.  Current hospital medications, please see the medication profile.  ALLERGIES:  [name] has no known allergies.  FAMILY HISTORY:  [name] denies any history of colon cancer, liver, or digestive problems.  SOCIAL HISTORY:  [name] does not currently smoke, but [name] did smoke approximately 40 years ago about two packs per day for 3 years.  [name] denies any alcohol use.  [name] has two children.  [name] lives with her son. [name] is widowed.  REVIEW OF SYSTEMS:  Negative in detail except as mentioned in HPI.  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  PHYSICAL EXAMINATION:  VITALS:  138/85, pulse 75, respirations 20, 98.1, 95% on  room air.  [name] is in no apparent distress.  [name] is alert to person and place only.  [name] is not sure what year it is or who is the president.  [name] notes that [name] is in the hospital.  HEENT:  No thyromegaly noted.  No masses or adenopathy reported.  Very poor distention.  Mouth is dry.  LUNGS:  Coarse with scattered rhonchi, diminished at bases.  No wheezes noted.  CARDIAC:  S1 and S2 present. ABDOMEN:  Soft, nontender, nondistended.  Positive bowel sounds.  No hepatosplenomegaly.  EXTREMITIES:  Without edema.  Her left arm is contractured and [name] is crawled up in the bed; however, [name] states [name] walks with a walker.  NEURO:  Pleasantly demented, alert, and oriented to person and place only.  PERTINENT LABORATORY DATA:  For this admission, most recent CBC shows  a hemoglobin of 9 and hematocrit of 28.2.  Her white count is normal at 4.7.  [name] is heme-positive.  Basic metabolic panel shows appropriate renal function with a BUN of 20 and a creatinine of 1.17, calcium is 8.4.  Sodium is 136.  Anemia panel shows low iron at 40, low total iron binding capacity of 220 and a low percent saturation 18; however, vitamin B12 was normal as well as folate, ferritin is normal at 117.  RADIOLOGY:  Chest x-ray shows stable chest x-ray, slight hyperaeration, small hiatal hernia.  ASSESSMENT AND PLAN:  [name] is a pleasantly demented 70 year old female who was just recently discharged from the hospital and has now returned again with weakness, pneumonia, anemia, and now heme-positive stools and it is quite difficult to obtain history from her, it could be that [name] is presenting atypically and not able to actual report of any abdominal pain or discomfort.  [name] does have a history of peptic ulcer disease, which remains a differential.  We will likely plan on a colonoscopy plus or minus an endoscopy during this admission as [name] is heme-positive and continues to drift with her hemoglobin.  At the current moment, though [name] is stable without any overt signs of GI bleeding.  Her vitals are stable as well.  We would like to thank you for the referral of this nice lady and we will look forward to following you with her in the hospital.          ______________________________ Gerrit Halls, ANP-BC     AS/MEDQ  D:  01/04/2011  T:  01/04/2011  Job:  [id number]  cc:   R. Roetta Sessions, MD FACP [facility] [address] [place] [address]  Electronically Signed by Gerrit Halls  on 01/17/2011 02:53:38 PM Electronically Signed by Lorrin Goodell M.D. on 02/06/2011 09:17:43 AM

## 2011-02-28 NOTE — H&P (Signed)
NAME:  [name], [name]                  ACCOUNT NO.:  [id number]  MEDICAL RECORD NO.:  [id number]  LOCATION:  A308                          FACILITY:  [facility]  PHYSICIAN:  Melvyn Novas, MDDATE OF BIRTH:  1941-06-24  DATE OF ADMISSION:  12/31/2010 DATE OF DISCHARGE:  LH                             HISTORY & PHYSICAL   The patient is a 70 year-old white female discharged about 24 hours ago with a history of volume depletion, azotemia, possibly UTI, and anemia, seen in consultation by Gastroenterology, decided that possible endoscopy would be considered in the future as an outpatient as she was hemodynamically stable.  The patient was home for a day, did not fill her medicines.  She was brought back by her son this evening complaining of generalized weakness, inability to move and get out of bed.  She is alert and oriented and she was somewhat hypotensive with a systolic of 92.  Tongue was mildly dry.  Clinically, she was volume depleted.  She was given aggressive IV hydration and chest x-ray revealed questionable atelectasis versus infiltrate in left lower lobe.  She denies cough, sputum, or hemoptysis.  She denies melena or hematochezia.  Her hemoglobin is 8.9 and was 9.9 upon discharge.  Rectal was heme negative in ER by ER doctor.  She is admitted for all the above conditions, generalized weakness, asthenia, worsening anemia, hypotension, and consideration of early pneumonia and/or septicemia.  We will perform procalcitonin and lactic acid levels now and place on dual antibiotics as she has been in healthcare facility recently.  PAST MEDICAL HISTORY:  Significant for: 1. Dehydration. 2. Hypothyroidism. 3. Iron-deficiency anemia. 4. Depression. 5. Hyperlipidemia. 6. Peptic ulcer disease. 7. H. pylori gastritis.  PAST SURGICAL HISTORY:  Remarkable for colonoscopy in 2007 and a fractured radius.  CURRENT MEDICATIONS:  Discharge medicines of: 1. Feosol 325 p.o.  b.i.d. 2. Crestor 20 mg per day. 3. Synthroid 75 mcg p.o. daily. 4. Lorazepam 1 mg p.o. b.i.d. 5. Keflex 500 mg p.o. b.i.d. However, she did not fill these in the last 36 hours.  She has no known allergies.  She does have a history of chain smoking for 40 years.  She states she quit cigarettes 3-4 years ago.  PHYSICAL EXAMINATION:  VITAL SIGNS:  Blood pressure at present is 102/74, respiratory rate is 18, and she is afebrile. HEENT:  Eyes, PERRL.  Extraocular movements intact.  Sclerae clear. Conjunctivae pink.  Tongue shows dry mucosa.  No erythema.  No exudates. NECK:  No JVD.  No carotid bruits.  No thyromegaly.  No thyroid bruits. LUNGS:  Prolonged expiratory phase.  No rales, wheeze, or rhonchi appreciable.  There is marked kyphosis noted. HEART:  Regular rhythm, a 1/6 aortic outflow murmur.  No S3, S4, gallops, heaves, thrills, or rubs. ABDOMEN:  Soft and nontender.  Bowel sounds normoactive.  No guarding, rebound, mass or megaly. RECTAL:  Heme negative in the ER. EXTREMITIES:  Trace to 1+ pedal edema. NEUROLOGIC:  Grossly intact cranial nerves and moves all 4 extremities.  IMPRESSION: 1. Hypotension. 2. Worsening anemia, hemoglobin 8.9, was 9.9. 3. Left base opacity compatible with early pneumonia or atelectasis,  recently in the hospital, possible healthcare acquired. 4. Hypothyroidism. 5. Anemia of chronic disease or multifactorial. 6. Hyperlipidemia. 7. Hypothyroidism. 8. Chronic obstructive pulmonary disease.  PLAN:  At present is to place her on vancomycin and Zosyn empirically. We will get procalcitonin and lactic acid to rule out any septicemia contributing to hypotension.  We will do stools for occult blood daily x3, serial hemoglobin/hematocrit.  We will hold her iron.  Decrease her Crestor from 20 to 10.  Continue Synthroid.  We will make further recommendations as the database expands.     Melvyn Novas, MD     RMD/MEDQ  D:   12/31/2010  T:  01/01/2011  Job:  [id number]  Electronically Signed by Oval Linsey MD on 02/28/2011 03:11:22 PM

## 2011-05-08 LAB — BASIC METABOLIC PANEL
Calcium: 9.9
Creatinine, Ser: 1
GFR calc non Af Amer: 55 — ABNORMAL LOW
Glucose, Bld: 94
Sodium: 136

## 2011-05-08 LAB — PROTIME-INR
INR: 0.9
Prothrombin Time: 11.9

## 2011-05-08 LAB — CBC
Hemoglobin: 13.9
Platelets: 281
RDW: 15.6 — ABNORMAL HIGH

## 2011-05-10 LAB — URINALYSIS, ROUTINE W REFLEX MICROSCOPIC
Glucose, UA: NEGATIVE
Hgb urine dipstick: NEGATIVE
Specific Gravity, Urine: 1.01
Urobilinogen, UA: 0.2

## 2011-05-10 LAB — BASIC METABOLIC PANEL
BUN: 11
Calcium: 9.2
Chloride: 99
Creatinine, Ser: 0.83
GFR calc Af Amer: >60
GFR calc non Af Amer: >60

## 2011-05-10 LAB — URINE MICROSCOPIC-ADD ON

## 2011-05-10 LAB — CBC
Platelets: 342
RBC: 4.35
WBC: 6.6

## 2011-05-10 LAB — DIFFERENTIAL
Lymphocytes Relative: 19
Lymphs Abs: 1.3
Monocytes Relative: 9
Neutro Abs: 4.6
Neutrophils Relative %: 70

## 2011-05-10 LAB — URINE CULTURE

## 2011-05-11 LAB — DIFFERENTIAL
Basophils Absolute: 0
Basophils Relative: 0
Basophils Relative: 1
Eosinophils Absolute: 0.1
Eosinophils Relative: 1
Eosinophils Relative: 2
Lymphs Abs: 1.2
Monocytes Absolute: 0.7
Monocytes Absolute: 1 — ABNORMAL HIGH
Monocytes Relative: 11
Neutro Abs: 4.7
Neutrophils Relative %: 67

## 2011-05-11 LAB — URINALYSIS, ROUTINE W REFLEX MICROSCOPIC
Glucose, UA: NEGATIVE
Ketones, ur: NEGATIVE
Nitrite: NEGATIVE
Specific Gravity, Urine: <1.005 — ABNORMAL LOW
pH: 5.5

## 2011-05-11 LAB — COMPREHENSIVE METABOLIC PANEL
ALT: 38 — ABNORMAL HIGH
AST: 30
Albumin: 3.7
Alkaline Phosphatase: 111
Calcium: 9.5
GFR calc Af Amer: >60
Potassium: 3.6
Sodium: 133 — ABNORMAL LOW
Total Protein: 7.7

## 2011-05-11 LAB — CBC
Hemoglobin: 11.9 — ABNORMAL LOW
Hemoglobin: 13.6
MCHC: 34.8
MCHC: 35.3
Platelets: 218
Platelets: 282
RDW: 13.9
RDW: 14.3 — ABNORMAL HIGH

## 2011-05-11 LAB — URINE MICROSCOPIC-ADD ON

## 2011-05-11 LAB — BASIC METABOLIC PANEL
BUN: 8
CO2: 30
Calcium: 9.2
Glucose, Bld: 108 — ABNORMAL HIGH
Sodium: 134 — ABNORMAL LOW

## 2011-05-11 LAB — SEDIMENTATION RATE: Sed Rate: 55 — ABNORMAL HIGH

## 2011-12-06 ENCOUNTER — Emergency Department (HOSPITAL_COMMUNITY): Payer: Medicare Other

## 2011-12-06 ENCOUNTER — Inpatient Hospital Stay (HOSPITAL_COMMUNITY)
Admission: EM | Admit: 2011-12-06 | Discharge: 2011-12-10 | DRG: 690 | Disposition: A | Discharge status: Home / Self Care | Payer: Medicare Other | Attending: Family Medicine | Admitting: Family Medicine

## 2011-12-06 ENCOUNTER — Encounter (HOSPITAL_COMMUNITY): Payer: Self-pay

## 2011-12-06 DIAGNOSIS — M129 Arthropathy, unspecified: Secondary | ICD-10-CM | POA: Diagnosis present

## 2011-12-06 DIAGNOSIS — E039 Hypothyroidism, unspecified: Secondary | ICD-10-CM | POA: Diagnosis present

## 2011-12-06 DIAGNOSIS — E785 Hyperlipidemia, unspecified: Secondary | ICD-10-CM | POA: Diagnosis present

## 2011-12-06 DIAGNOSIS — E860 Dehydration: Secondary | ICD-10-CM | POA: Diagnosis present

## 2011-12-06 DIAGNOSIS — N390 Urinary tract infection, site not specified: Principal | ICD-10-CM | POA: Diagnosis present

## 2011-12-06 DIAGNOSIS — I959 Hypotension, unspecified: Secondary | ICD-10-CM | POA: Diagnosis present

## 2011-12-06 DIAGNOSIS — I10 Essential (primary) hypertension: Secondary | ICD-10-CM | POA: Diagnosis present

## 2011-12-06 DIAGNOSIS — M1990 Unspecified osteoarthritis, unspecified site: Secondary | ICD-10-CM | POA: Diagnosis present

## 2011-12-06 DIAGNOSIS — F0280 Dementia in other diseases classified elsewhere without behavioral disturbance: Secondary | ICD-10-CM | POA: Diagnosis present

## 2011-12-06 DIAGNOSIS — N289 Disorder of kidney and ureter, unspecified: Secondary | ICD-10-CM

## 2011-12-06 DIAGNOSIS — F0390 Unspecified dementia without behavioral disturbance: Secondary | ICD-10-CM | POA: Diagnosis present

## 2011-12-06 DIAGNOSIS — F70 Mild intellectual disabilities: Secondary | ICD-10-CM | POA: Diagnosis present

## 2011-12-06 HISTORY — DX: Hyperlipidemia, unspecified: E78.5

## 2011-12-06 HISTORY — DX: Unspecified dementia without behavioral disturbance: F03.90

## 2011-12-06 HISTORY — DX: Hypothyroidism, unspecified: E03.9

## 2011-12-06 HISTORY — DX: Urinary tract infection, site not specified: N39.0

## 2011-12-06 LAB — URINALYSIS, ROUTINE W REFLEX MICROSCOPIC
Glucose, UA: NEGATIVE mg/dL
Hgb urine dipstick: NEGATIVE
Ketones, ur: NEGATIVE mg/dL
Leukocytes, UA: NEGATIVE
pH: 5.5 (ref 5.0–8.0)

## 2011-12-06 LAB — COMPREHENSIVE METABOLIC PANEL
AST: 46 U/L — ABNORMAL HIGH (ref 0–37)
Albumin: 2.8 g/dL — ABNORMAL LOW (ref 3.5–5.2)
Alkaline Phosphatase: 130 U/L — ABNORMAL HIGH (ref 39–117)
BUN: 27 mg/dL — ABNORMAL HIGH (ref 6–23)
Chloride: 107 mEq/L (ref 96–112)
Potassium: 3.9 mEq/L (ref 3.5–5.1)
Sodium: 141 mEq/L (ref 135–145)
Total Bilirubin: 0.3 mg/dL (ref 0.3–1.2)
Total Protein: 7.3 g/dL (ref 6.0–8.3)

## 2011-12-06 LAB — LACTIC ACID, PLASMA: Lactic Acid, Venous: 1.4 mmol/L (ref 0.5–2.2)

## 2011-12-06 LAB — CBC
Hemoglobin: 11.2 g/dL — ABNORMAL LOW (ref 12.0–15.0)
MCH: 29.4 pg (ref 26.0–34.0)
MCHC: 31.6 g/dL (ref 30.0–36.0)
Platelets: 329 10*3/uL (ref 150–400)
RDW: 15.1 % (ref 11.5–15.5)

## 2011-12-06 LAB — DIFFERENTIAL
Basophils Absolute: 0 10*3/uL (ref 0.0–0.1)
Basophils Relative: 0 % (ref 0–1)
Eosinophils Absolute: 0.2 10*3/uL (ref 0.0–0.7)
Neutro Abs: 6.1 10*3/uL (ref 1.7–7.7)
Neutrophils Relative %: 78 % — ABNORMAL HIGH (ref 43–77)

## 2011-12-06 LAB — CARDIAC PANEL(CRET KIN+CKTOT+MB+TROPI)
Relative Index: INVALID (ref 0.0–2.5)
Total CK: 45 U/L (ref 7–177)
Troponin I: <0.3 ng/mL (ref <0.30)

## 2011-12-06 LAB — URINE MICROSCOPIC-ADD ON

## 2011-12-06 MED ORDER — DEXTROSE 5 % IV SOLN
1.0000 g | Freq: Once | INTRAVENOUS | Status: AC
  Administered 2011-12-06: 1 g via INTRAVENOUS
  Filled 2011-12-06: qty 10

## 2011-12-06 MED ORDER — SODIUM CHLORIDE 0.9 % IV BOLUS (SEPSIS)
1000.0000 mL | Freq: Once | INTRAVENOUS | Status: AC
  Administered 2011-12-06: 1000 mL via INTRAVENOUS

## 2011-12-06 MED ORDER — SODIUM CHLORIDE 0.9 % IV SOLN
Freq: Once | INTRAVENOUS | Status: AC
  Administered 2011-12-06: via INTRAVENOUS

## 2011-12-06 NOTE — ED Notes (Signed)
Pt reports no dizziness or other sx from low B/P; Pt reports weakness prior to admit; Will notify MD of low BP

## 2011-12-06 NOTE — ED Notes (Signed)
Ems called for pt "not acting right"  States has been dizzy, weak, dehydrated

## 2011-12-06 NOTE — ED Provider Notes (Signed)
History   This chart was scribed for Dione Booze, MD scribed by Magnus Sinning. The patient was seen in room APA06/APA06 seen at 21:38    CSN: [id number]  Arrival date & time 12/06/11  2114   First MD Initiated Contact with Patient 12/06/11 2134      Chief Complaint  Patient presents with  . Dehydration    (Consider location/radiation/quality/duration/timing/severity/associated sxs/prior treatment) HPI-Level 5 Caveat Dementia [name] is a 71 y.o. female who presents to the Emergency Department complaining of moderate dehydration with associated low BP,onset this evening. Relative suspected she was dehydrated, stating that she kept asking to "drink sodas" and he noticed while giving her a bath that she started "acting abnormally and was having difficulties holding her head up."  Patient relative also states she has been admitted previously for the same sx, which reports confirm occurred almost one year ago.  Denies vomiting, diarrhea, dizziness, or weakness. PCP: Dr. Renard Matter Past Medical History  Diagnosis Date  . Hyperlipemia   . Hypothyroid     History reviewed. No pertinent past surgical history.  No family history on file.  History  Substance Use Topics  . Smoking status: Never Smoker   . Smokeless tobacco: Not on file  . Alcohol Use: No    Review of Systems  Unable to perform ROS: Dementia   10 Systems reviewed and are negative for acute change except as noted in the HPI. Allergies  Review of patient's allergies indicates no known allergies.  Home Medications  No current outpatient prescriptions on file.  BP 66/49  Pulse 98  Temp(Src) 97.7 F (36.5 C) (Oral)  Ht 5\' 5"  (1.651 m)  Wt 105 lb (47.628 kg)  BMI 17.47 kg/m2  SpO2 96%  Physical Exam  Nursing note and vitals reviewed. Constitutional: She appears well-developed and well-nourished. No distress.  HENT:  Head: Normocephalic and atraumatic.       Mucous membranes dry  Eyes: EOM are normal.  Pupils are equal, round, and reactive to light.  Neck: Neck supple. No tracheal deviation present.  Cardiovascular: Normal rate.   Pulmonary/Chest: Effort normal. No respiratory distress.  Abdominal: Soft. She exhibits no distension.  Musculoskeletal: Normal range of motion. She exhibits no edema.  Neurological: She is alert. No sensory deficit.       Oriented to person, but not to place or time  Skin: Skin is warm and dry.  Psychiatric: She has a normal mood and affect. Her behavior is normal.    ED Course  Procedures (including critical care time) DIAGNOSTIC STUDIES: Oxygen Saturation is 96% on room air, normal by my interpretation.    COORDINATION OF CARE: 22:35: EDMD performs recheck and notes BP has come up a bit and and pt is NAD.  Results for orders placed during the hospital encounter of 12/06/11  CBC      Component Value Range   WBC 7.8  4.0 - 10.5 (K/uL)   RBC 3.81 (*) 3.87 - 5.11 (MIL/uL)   Hemoglobin 11.2 (*) 12.0 - 15.0 (g/dL)   HCT 14.7 (*) 82.9 - 46.0 (%)   MCV 92.9  78.0 - 100.0 (fL)   MCH 29.4  26.0 - 34.0 (pg)   MCHC 31.6  30.0 - 36.0 (g/dL)   RDW 56.2  13.0 - 86.5 (%)   Platelets 329  150 - 400 (K/uL)  DIFFERENTIAL      Component Value Range   Neutrophils Relative 78 (*) 43 - 77 (%)   Neutro Abs 6.1  1.7 - 7.7 (K/uL)   Lymphocytes Relative 12  12 - 46 (%)   Lymphs Abs 0.9  0.7 - 4.0 (K/uL)   Monocytes Relative 7  3 - 12 (%)   Monocytes Absolute 0.5  0.1 - 1.0 (K/uL)   Eosinophils Relative 3  0 - 5 (%)   Eosinophils Absolute 0.2  0.0 - 0.7 (K/uL)   Basophils Relative 0  0 - 1 (%)   Basophils Absolute 0.0  0.0 - 0.1 (K/uL)  COMPREHENSIVE METABOLIC PANEL      Component Value Range   Sodium 141  135 - 145 (mEq/L)   Potassium 3.9  3.5 - 5.1 (mEq/L)   Chloride 107  96 - 112 (mEq/L)   CO2 24  19 - 32 (mEq/L)   Glucose, Bld 117 (*) 70 - 99 (mg/dL)   BUN 27 (*) 6 - 23 (mg/dL)   Creatinine, Ser 1.61 (*) 0.50 - 1.10 (mg/dL)   Calcium 09.6  8.4 - 10.5  (mg/dL)   Total Protein 7.3  6.0 - 8.3 (g/dL)   Albumin 2.8 (*) 3.5 - 5.2 (g/dL)   AST 46 (*) 0 - 37 (U/L)   ALT 28  0 - 35 (U/L)   Alkaline Phosphatase 130 (*) 39 - 117 (U/L)   Total Bilirubin 0.3  0.3 - 1.2 (mg/dL)   GFR calc non Af Amer 37 (*) >90 (mL/min)   GFR calc Af Amer 42 (*) >90 (mL/min)  URINALYSIS, ROUTINE W REFLEX MICROSCOPIC      Component Value Range   Color, Urine YELLOW  YELLOW    APPearance CLEAR  CLEAR    Specific Gravity, Urine 1.025  1.005 - 1.030    pH 5.5  5.0 - 8.0    Glucose, UA NEGATIVE  NEGATIVE (mg/dL)   Hgb urine dipstick NEGATIVE  NEGATIVE    Bilirubin Urine NEGATIVE  NEGATIVE    Ketones, ur NEGATIVE  NEGATIVE (mg/dL)   Protein, ur TRACE (*) NEGATIVE (mg/dL)   Urobilinogen, UA 0.2  0.0 - 1.0 (mg/dL)   Nitrite POSITIVE (*) NEGATIVE    Leukocytes, UA NEGATIVE  NEGATIVE   CARDIAC PANEL(CRET KIN+CKTOT+MB+TROPI)      Component Value Range   Total CK 45  7 - 177 (U/L)   CK, MB 2.3  0.3 - 4.0 (ng/mL)   Troponin I <0.30  <0.30 (ng/mL)   Relative Index RELATIVE INDEX IS INVALID  0.0 - 2.5   LACTIC ACID, PLASMA      Component Value Range   Lactic Acid, Venous 1.4  0.5 - 2.2 (mmol/L)  URINE MICROSCOPIC-ADD ON      Component Value Range   Squamous Epithelial / LPF RARE  RARE    WBC, UA 7-10  <3 (WBC/hpf)   RBC / HPF 0-2  <3 (RBC/hpf)   Bacteria, UA MANY (*) RARE    Dg Chest Port 1 View  12/06/2011  *RADIOLOGY REPORT*  Clinical Data: Dizziness.  Weakness.  Dehydration  PORTABLE CHEST - 1 VIEW  Comparison: 01/03/2011  Findings: Atherosclerotic calcification of the aortic arch and left axillary artery noted.  Heart size is in the upper normal range.  The lungs appear clear.  No pleural effusion is observed.  Old left rib fractures noted.  IMPRESSION:  1.  No acute findings. 2.  Atherosclerosis. 3.  Old left rib fractures.  Original Report Authenticated By: Dellia Cloud, M.D.    Date: 12/07/2011  Rate: 96  Rhythm: normal sinus rhythm  QRS Axis:  normal  Intervals: normal  ST/T Wave abnormalities: nonspecific T wave changes  Conduction Disutrbances:none  Narrative Interpretation: When compared with ECG of 12/31/2010, minor nonspecific T wave flattening is now present.  Old EKG Reviewed: changes noted      1. Hypotension   2. Urinary tract infection   3. Renal insufficiency    CRITICAL CARE Performed by: Dione Booze   Total critical care time: 50 minutes  Critical care time was exclusive of separately billable procedures and treating other patients.  Critical care was necessary to treat or prevent imminent or life-threatening deterioration.  Critical care was time spent personally by me on the following activities: development of treatment plan with patient and/or surrogate as well as nursing, discussions with consultants, evaluation of patient's response to treatment, examination of patient, obtaining history from patient or surrogate, ordering and performing treatments and interventions, ordering and review of laboratory studies, ordering and review of radiographic studies, pulse oximetry and re-evaluation of patient's condition.    MDM  Hypotension which may be related to dehydration. Also need to evaluate for possible infection. Old records are reviewed and she had a hospitalization for virtually the identical problem one year ago.  After 1 L of saline, blood pressures come up slightly. She's given an additional liter of saline and blood pressures come up to the high 80s systolic. Urinalysis has shown evidence of a urinary tract infection. In spite of hypotension, it is noted that she has a normal lactic acid. She does not appear toxic in any way. Urine cultures obtained and she is given a dose of Rocephin. Case is discussed with Dr. Orvan Falconer who is on call for Dr. Renard Matter who agrees to admit the patient to step down unit.  I personally performed the services described in this documentation, which was scribed in my  presence. The recorded information has been reviewed and considered.          Dione Booze, MD 12/07/11 [phone]

## 2011-12-07 ENCOUNTER — Encounter (HOSPITAL_COMMUNITY): Payer: Self-pay | Admitting: Internal Medicine

## 2011-12-07 DIAGNOSIS — I959 Hypotension, unspecified: Secondary | ICD-10-CM

## 2011-12-07 DIAGNOSIS — I10 Essential (primary) hypertension: Secondary | ICD-10-CM | POA: Diagnosis present

## 2011-12-07 DIAGNOSIS — E785 Hyperlipidemia, unspecified: Secondary | ICD-10-CM | POA: Diagnosis present

## 2011-12-07 DIAGNOSIS — M1990 Unspecified osteoarthritis, unspecified site: Secondary | ICD-10-CM | POA: Diagnosis present

## 2011-12-07 DIAGNOSIS — E860 Dehydration: Secondary | ICD-10-CM

## 2011-12-07 DIAGNOSIS — F0390 Unspecified dementia without behavioral disturbance: Secondary | ICD-10-CM

## 2011-12-07 DIAGNOSIS — E039 Hypothyroidism, unspecified: Secondary | ICD-10-CM | POA: Diagnosis present

## 2011-12-07 DIAGNOSIS — N390 Urinary tract infection, site not specified: Secondary | ICD-10-CM

## 2011-12-07 HISTORY — DX: Urinary tract infection, site not specified: N39.0

## 2011-12-07 HISTORY — DX: Unspecified dementia, unspecified severity, without behavioral disturbance, psychotic disturbance, mood disturbance, and anxiety: F03.90

## 2011-12-07 LAB — CBC
HCT: 30.2 % — ABNORMAL LOW (ref 36.0–46.0)
Hemoglobin: 9.5 g/dL — ABNORMAL LOW (ref 12.0–15.0)
MCH: 29.3 pg (ref 26.0–34.0)
MCHC: 31.5 g/dL (ref 30.0–36.0)
MCV: 93.2 fL (ref 78.0–100.0)
RDW: 15.2 % (ref 11.5–15.5)

## 2011-12-07 LAB — BASIC METABOLIC PANEL
BUN: 22 mg/dL (ref 6–23)
Chloride: 111 mEq/L (ref 96–112)
Creatinine, Ser: 1.09 mg/dL (ref 0.50–1.10)
GFR calc Af Amer: 58 mL/min — ABNORMAL LOW (ref >90)
Glucose, Bld: 85 mg/dL (ref 70–99)
Potassium: 4.3 mEq/L (ref 3.5–5.1)

## 2011-12-07 MED ORDER — TRAZODONE HCL 50 MG PO TABS
25.0000 mg | ORAL_TABLET | Freq: Every evening | ORAL | Status: DC | PRN
  Filled 2011-12-07: qty 1

## 2011-12-07 MED ORDER — ACETAMINOPHEN 325 MG PO TABS: 650.0000 mg | ORAL_TABLET | Freq: Four times a day (QID) | ORAL | Status: DC | PRN

## 2011-12-07 MED ORDER — ACETAMINOPHEN 650 MG RE SUPP: 650.0000 mg | Freq: Four times a day (QID) | RECTAL | Status: DC | PRN

## 2011-12-07 MED ORDER — LEVOTHYROXINE SODIUM 75 MCG PO TABS
75.0000 ug | ORAL_TABLET | Freq: Every day | ORAL | Status: DC
  Administered 2011-12-07 – 2011-12-10 (×4): 75 ug via ORAL
  Filled 2011-12-07 (×4): qty 1

## 2011-12-07 MED ORDER — BENZONATATE 100 MG PO CAPS
100.0000 mg | ORAL_CAPSULE | Freq: Three times a day (TID) | ORAL | Status: DC
  Administered 2011-12-07 – 2011-12-09 (×10): 100 mg via ORAL
  Filled 2011-12-07 (×12): qty 1

## 2011-12-07 MED ORDER — POTASSIUM CHLORIDE IN NACL 20-0.9 MEQ/L-% IV SOLN
INTRAVENOUS | Status: AC
  Administered 2011-12-07: 02:00:00 via INTRAVENOUS

## 2011-12-07 MED ORDER — ENOXAPARIN SODIUM 30 MG/0.3ML ~~LOC~~ SOLN
30.0000 mg | SUBCUTANEOUS | Status: DC
  Administered 2011-12-07 – 2011-12-10 (×4): 30 mg via SUBCUTANEOUS
  Filled 2011-12-07 (×4): qty 0.3

## 2011-12-07 MED ORDER — ONDANSETRON HCL 4 MG PO TABS: 4.0000 mg | ORAL_TABLET | Freq: Four times a day (QID) | ORAL | Status: DC | PRN

## 2011-12-07 MED ORDER — FLEET ENEMA 7-19 GM/118ML RE ENEM: 1.0000 | ENEMA | Freq: Once | RECTAL | Status: AC | PRN

## 2011-12-07 MED ORDER — DEXTROSE 5 % IV SOLN
1.0000 g | INTRAVENOUS | Status: DC
  Administered 2011-12-07 – 2011-12-09 (×3): 1 g via INTRAVENOUS
  Filled 2011-12-07 (×4): qty 10

## 2011-12-07 MED ORDER — GUAIFENESIN 100 MG/5ML PO SOLN: 5.0000 mL | ORAL | Status: DC | PRN

## 2011-12-07 MED ORDER — ONDANSETRON HCL 4 MG/2ML IJ SOLN: 4.0000 mg | Freq: Four times a day (QID) | INTRAMUSCULAR | Status: DC | PRN

## 2011-12-07 MED ORDER — BISACODYL 10 MG RE SUPP: 10.0000 mg | Freq: Every day | RECTAL | Status: DC | PRN

## 2011-12-07 MED ORDER — ENOXAPARIN SODIUM 40 MG/0.4ML ~~LOC~~ SOLN: 40.0000 mg | SUBCUTANEOUS | Status: DC

## 2011-12-07 MED ORDER — SENNOSIDES-DOCUSATE SODIUM 8.6-50 MG PO TABS: 1.0000 | ORAL_TABLET | Freq: Every evening | ORAL | Status: DC | PRN

## 2011-12-07 MED ORDER — DONEPEZIL HCL 5 MG PO TABS
5.0000 mg | ORAL_TABLET | Freq: Every day | ORAL | Status: DC
  Administered 2011-12-07 – 2011-12-09 (×3): 5 mg via ORAL
  Filled 2011-12-07 (×4): qty 1

## 2011-12-07 MED ORDER — SODIUM CHLORIDE 0.9 % IJ SOLN
3.0000 mL | Freq: Two times a day (BID) | INTRAMUSCULAR | Status: DC
  Administered 2011-12-07 – 2011-12-09 (×6): 3 mL via INTRAVENOUS
  Filled 2011-12-07 (×6): qty 3

## 2011-12-07 MED ORDER — BIOTENE DRY MOUTH MT LIQD
15.0000 mL | Freq: Two times a day (BID) | OROMUCOSAL | Status: DC
  Administered 2011-12-07 – 2011-12-09 (×6): 15 mL via OROMUCOSAL

## 2011-12-07 MED ORDER — SIMVASTATIN 20 MG PO TABS
40.0000 mg | ORAL_TABLET | Freq: Every evening | ORAL | Status: DC
  Administered 2011-12-07 – 2011-12-09 (×3): 40 mg via ORAL
  Filled 2011-12-07: qty 1
  Filled 2011-12-07 (×3): qty 2

## 2011-12-07 NOTE — Progress Notes (Signed)
UR Chart Review Completed  

## 2011-12-07 NOTE — Care Management Note (Unsigned)
    Page 1 of 1   12/08/2011     12:19:10 PM   CARE MANAGEMENT NOTE 12/08/2011  Patient:  [name], [name]   Account Number:  [id number]  Date Initiated:  12/07/2011  Documentation initiated by:  Sharrie Rothman  Subjective/Objective Assessment:   Pt admitted from home with uti and dehydration. Pts son [name] lives with her and takes care of her. Pt is confused about some things.     Action/Plan:   CM attepmted to call pts son and number has been disconnected. Will ask pts nurse to get a working number from the pts son so that CM can discuss discharge plans.   Anticipated DC Date:  12/13/2011   Anticipated DC Plan:  HOME W HOME HEALTH SERVICES      DC Planning Services  CM consult      Choice offered to / List presented to:             Status of service:  In process, will continue to follow Medicare Important Message given?   (If response is "NO", the following Medicare IM given date fields will be blank) Date Medicare IM given:   Date Additional Medicare IM given:    Discharge Disposition:    Per UR Regulation:    If discussed at Long Length of Stay Meetings, dates discussed:    Comments:  12/08/11 1213 Arlyss Queen, RN BSN CM Spoke with pts son [name], and he stated that he lives with his mother and takes care of her. When he is working a nephew and his wife stay with the pt so that the pt is never left alone. Pts son stated that she gets up out of bed and is able to shuffle around with assistance to different areas of the home. Pt son declined any HH at this time. 12/07/11 1330 Arlyss Queen, RN BSN CM

## 2011-12-07 NOTE — H&P (Signed)
PCP:   Alice Reichert, MD, MD   Chief Complaint:  Weak, unable to hold up head, since today  HPI: [name] is an 71 y.o. female.   History of dementia and reportedly lives with her son who brought her in reporting that she's been getting progressively weak and has been drinking a lot of sodas since she had a similar episode last year which resulted a being diagnosed with dehydration he brought her into be evaluated.  Son is no longer available to corroborate history, but this lady is surprisingly oriented to place and the reason for being here, and denies any fever or cold or chills, she does admit to cough. He denies nausea vomiting or diarrhea. She denies increased urinary frequency.  She is very quick and clear that she does not want CPR, intubation or intubation, but would want IV pressors if necessary.  At baseline she reports that she ambulates with the assistance of a cane; he denies recent falls. She denies significant family medical history  Rewiew of Systems:  The patient denies anorexia, fever, weight loss,, vision loss, decreased hearing, hoarseness, chest pain, syncope, dyspnea on exertion, peripheral edema, balance deficits, hemoptysis, abdominal pain, melena, hematochezia, severe indigestion/heartburn, hematuria, incontinence, genital sores, muscle weakness, suspicious skin lesions, transient blindness, difficulty walking, depression, unusual weight change, abnormal bleeding, enlarged lymph nodes, angioedema, and breast masses.    Past Medical History  Diagnosis Date  . Hyperlipemia   . Hypothyroid   . Dementia 12/07/2011  . UTI (lower urinary tract infection) 12/07/2011    History reviewed. No pertinent past surgical history.  Medications:  HOME MEDS: Prior to Admission medications   Medication Sig Start Date End Date Taking? Authorizing Provider  donepezil (ARICEPT) 5 MG tablet Take 5 mg by mouth daily.   Yes Historical Provider, MD  levothyroxine (SYNTHROID,  LEVOTHROID) 75 MCG tablet Take 75 mcg by mouth daily.   Yes Historical Provider, MD  olmesartan (BENICAR) 40 MG tablet Take 40 mg by mouth daily.   Yes Historical Provider, MD  simvastatin (ZOCOR) 40 MG tablet Take 40 mg by mouth every evening.   Yes Historical Provider, MD     Allergies:  No Known Allergies  Social History:   reports that she has never smoked. She does not have any smokeless tobacco history on file. She reports that she does not drink alcohol or use illicit drugs.  Family History: No family history on file.   Physical Exam: Filed Vitals:   12/06/11 2239 12/06/11 2245 12/06/11 2345 12/07/11 0045  BP: 85/58 89/61 87/56  99/61  Pulse: 89 90 88 93  Temp:      TempSrc:      Resp:  21 25 26   Height:      Weight:      SpO2: 100% 98% 98% 97%   Blood pressure 99/61, pulse 93, temperature 97.7 F (36.5 C), temperature source Oral, resp. rate 26, height 5\' 5"  (1.651 m), weight 47.628 kg (105 lb), SpO2 97.00%.  GEN:  Pleasant emaciated elderly lady lying in the stretcher in no acute distress; cooperative with exam PSYCH:  alert and oriented x3; does not appear anxious or depressed; affect is appropriate. HEENT: Mucous membranes pink, dry, and anicteric; PERRLA; EOM intact; no cervical lymphadenopathy nor thyromegaly  no JVD; poor dentition with missing and care his teeth Breasts:: Not examined CHEST WALL: No tenderness CHEST: Normal respiration, clear to auscultation bilaterally HEART: Regular rate and rhythm; no murmurs rubs or gallops BACK:  kyphosis no  scoliosis; no CVA tenderness ABDOMEN:  soft non-tender; no masses, no organomegaly, normal abdominal bowel sounds;  Rectal Exam: Not done EXTREMITIES: Extensive arthropathy and deformity of the fingers; extend muscle wasting; no edema; no ulcerations. Genitalia: not examined PULSES: 2+ and symmetric SKIN: Normal hydration no rash or ulceration CNS: Cranial nerves 2-12 grossly intact no focal lateralizing neurologic  deficit   Labs & Imaging Results for orders placed during the hospital encounter of 12/06/11 (from the past 48 hour(s))  CBC     Status: Abnormal   Collection Time   12/06/11  9:42 PM      Component Value Range Comment   WBC 7.8  4.0 - 10.5 (K/uL)    RBC 3.81 (*) 3.87 - 5.11 (MIL/uL)    Hemoglobin 11.2 (*) 12.0 - 15.0 (g/dL)    HCT 16.1 (*) 09.6 - 46.0 (%)    MCV 92.9  78.0 - 100.0 (fL)    MCH 29.4  26.0 - 34.0 (pg)    MCHC 31.6  30.0 - 36.0 (g/dL)    RDW 04.5  40.9 - 81.1 (%)    Platelets 329  150 - 400 (K/uL)   DIFFERENTIAL     Status: Abnormal   Collection Time   12/06/11  9:42 PM      Component Value Range Comment   Neutrophils Relative 78 (*) 43 - 77 (%)    Neutro Abs 6.1  1.7 - 7.7 (K/uL)    Lymphocytes Relative 12  12 - 46 (%)    Lymphs Abs 0.9  0.7 - 4.0 (K/uL)    Monocytes Relative 7  3 - 12 (%)    Monocytes Absolute 0.5  0.1 - 1.0 (K/uL)    Eosinophils Relative 3  0 - 5 (%)    Eosinophils Absolute 0.2  0.0 - 0.7 (K/uL)    Basophils Relative 0  0 - 1 (%)    Basophils Absolute 0.0  0.0 - 0.1 (K/uL)   COMPREHENSIVE METABOLIC PANEL     Status: Abnormal   Collection Time   12/06/11  9:42 PM      Component Value Range Comment   Sodium 141  135 - 145 (mEq/L)    Potassium 3.9  3.5 - 5.1 (mEq/L)    Chloride 107  96 - 112 (mEq/L)    CO2 24  19 - 32 (mEq/L)    Glucose, Bld 117 (*) 70 - 99 (mg/dL)    BUN 27 (*) 6 - 23 (mg/dL)    Creatinine, Ser 9.14 (*) 0.50 - 1.10 (mg/dL)    Calcium 78.2  8.4 - 10.5 (mg/dL)    Total Protein 7.3  6.0 - 8.3 (g/dL)    Albumin 2.8 (*) 3.5 - 5.2 (g/dL)    AST 46 (*) 0 - 37 (U/L)    ALT 28  0 - 35 (U/L)    Alkaline Phosphatase 130 (*) 39 - 117 (U/L)    Total Bilirubin 0.3  0.3 - 1.2 (mg/dL)    GFR calc non Af Amer 37 (*) >90 (mL/min)    GFR calc Af Amer 42 (*) >90 (mL/min)   CARDIAC PANEL(CRET KIN+CKTOT+MB+TROPI)     Status: Normal   Collection Time   12/06/11  9:42 PM      Component Value Range Comment   Total CK 45  7 - 177 (U/L)    CK,  MB 2.3  0.3 - 4.0 (ng/mL)    Troponin I <0.30  <0.30 (ng/mL)    Relative Index RELATIVE INDEX  IS INVALID  0.0 - 2.5    LACTIC ACID, PLASMA     Status: Normal   Collection Time   12/06/11  9:59 PM      Component Value Range Comment   Lactic Acid, Venous 1.4  0.5 - 2.2 (mmol/L)   URINALYSIS, ROUTINE W REFLEX MICROSCOPIC     Status: Abnormal   Collection Time   12/06/11 10:30 PM      Component Value Range Comment   Color, Urine YELLOW  YELLOW     APPearance CLEAR  CLEAR     Specific Gravity, Urine 1.025  1.005 - 1.030     pH 5.5  5.0 - 8.0     Glucose, UA NEGATIVE  NEGATIVE (mg/dL)    Hgb urine dipstick NEGATIVE  NEGATIVE     Bilirubin Urine NEGATIVE  NEGATIVE     Ketones, ur NEGATIVE  NEGATIVE (mg/dL)    Protein, ur TRACE (*) NEGATIVE (mg/dL)    Urobilinogen, UA 0.2  0.0 - 1.0 (mg/dL)    Nitrite POSITIVE (*) NEGATIVE     Leukocytes, UA NEGATIVE  NEGATIVE    URINE MICROSCOPIC-ADD ON     Status: Abnormal   Collection Time   12/06/11 10:30 PM      Component Value Range Comment   Squamous Epithelial / LPF RARE  RARE     WBC, UA 7-10  <3 (WBC/hpf)    RBC / HPF 0-2  <3 (RBC/hpf)    Bacteria, UA MANY (*) RARE     Dg Chest Port 1 View  12/06/2011  *RADIOLOGY REPORT*  Clinical Data: Dizziness.  Weakness.  Dehydration  PORTABLE CHEST - 1 VIEW  Comparison: 01/03/2011  Findings: Atherosclerotic calcification of the aortic arch and left axillary artery noted.  Heart size is in the upper normal range.  The lungs appear clear.  No pleural effusion is observed.  Old left rib fractures noted.  IMPRESSION:  1.  No acute findings. 2.  Atherosclerosis. 3.  Old left rib fractures.  Original Report Authenticated By: Dellia Cloud, M.D.      Assessment Present on Admission:  .UTI (lower urinary tract infection) .Dehydration .Hypotension,  likely due to dehydration   .Hypothyroidism .Arthritis .Hyperlipidemia .Dementia, mild  .HTN (hypertension) now hypotensive   PLAN:  admit this lady  for hydration and antibiotic therapy. Will continue ceftriaxone pending definitive urine cultures vigorous hydration, for 12 hours only because of her small size.  Primary care physician will resume care later this morning, and reviewed IV fluid orders  Other plans as per orders.   Wes Lezotte 12/07/2011, 1:17 AM

## 2011-12-07 NOTE — Progress Notes (Signed)
NAME:  [name], [name]                  ACCOUNT NO.:  [id number]  MEDICAL RECORD NO.:  [id number]  LOCATION:  A332                          FACILITY:  [facility]  PHYSICIAN:  Diondra Pines G. Renard Matter, MD   DATE OF BIRTH:  01-Dec-1940  DATE OF PROCEDURE:  12/07/2011 DATE OF DISCHARGE:                                PROGRESS NOTE   SUBJECTIVE:  This patient was admitted through the emergency department with dehydration.  She does have slightly elevated creatinine at 1.41. She was started on intravenous fluids.  Has not had any vomiting or diarrhea.  OBJECTIVE:  VITAL SIGNS:  Blood pressure 111/71, respirations 22, pulse 87, temperature 98.1. LUNGS:  Clear to P and A. HEART:  Regular rhythm. ABDOMEN:  No palpable organs or masses. SKIN:  Warm and dry.  ASSESSMENT:  The patient was admitted with dehydration, slight elevation of creatinine.  PLAN:  To rehydrate the patient.  Also of note, the patient did have a urinary tract infection and was started on IV Rocephin.  We will continue these medications.     Yusuf Yu G. Renard Matter, MD     AGM/MEDQ  D:  12/07/2011  T:  12/07/2011  Job:  [id number]

## 2011-12-07 NOTE — Evaluation (Signed)
Physical Therapy Evaluation Patient Details Name: [name] MRN: [id number] DOB: Dec 01, 1940 Today's Date: 12/07/2011 Time: 1201-1230 PT Time Calculation (min): 29 min  PT Assessment / Plan / Recommendation Clinical Impression  Pt was seen for eval, and appears to be at prior level of function.  I have worked with her in the past.  No PT is needed.    PT Assessment  Patent does not need any further PT services    Follow Up Recommendations  No PT follow up    Barriers to Discharge        lEquipment Recommendations  None recommended by PT    Recommendations for Other Services     Frequency      Precautions / Restrictions Precautions Precautions: Fall Restrictions Weight Bearing Restrictions: No   Pertinent Vitals/Pain       Mobility  Bed Mobility Bed Mobility: Supine to Sit;Sit to Supine Supine to Sit: 6: Modified independent (Device/Increase time);With rails;HOB flat Sit to Supine: 6: Modified independent (Device/Increase time);HOB flat Transfers Transfers: Stand to Sit;Sit to Stand Sit to Stand: 6: Modified independent (Device/Increase time) Stand to Sit: 6: Modified independent (Device/Increase time) Ambulation/Gait Ambulation/Gait Assistance: 5: Supervision Ambulation Distance (Feet): 100 Feet Assistive device: Rolling walker Ambulation/Gait Assistance Details: pt has a very awkward gait pattern, especially initially...she has difficulty advancing either foot to step, trunk is rotated severely to the R...gait finally evens out and becomes funtional Gait Pattern: Step-through pattern;Trunk rotated posteriorly on left;Trunk flexed;Right flexed knee in stance;Left flexed knee in stance General Gait Details: gait appears to be stable, however is very poor in form Stairs: No Wheelchair Mobility Wheelchair Mobility: No    Exercises     PT Diagnosis:    PT Problem List:   PT Treatment Interventions:     PT Goals    Visit Information  Last PT Received On:  12/07/11    Subjective Data  Subjective: no c/o Patient Stated Goal: none stated   Prior Functioning  Home Living Lives With: Son Available Help at Discharge: Family Type of Home: House Home Access: Stairs to enter Secretary/administrator of Steps: 2 Entrance Stairs-Rails: None Home Layout: One level Home Adaptive Equipment: Environmental consultant - rolling;Straight cane Prior Function Level of Independence: Needs assistance Able to Take Stairs?: Yes Driving: No Vocation: Retired Musician: No difficulties    Cognition  Overall Cognitive Status: Appears within functional limits for tasks assessed/performed Arousal/Alertness: Awake/alert Orientation Level: Appears intact for tasks assessed Behavior During Session: [facility] for tasks performed    Extremity/Trunk Assessment Right Lower Extremity Assessment RLE ROM/Strength/Tone: WFL for tasks assessed RLE Sensation: WFL - Light Touch RLE Coordination: WFL - gross motor Left Lower Extremity Assessment LLE ROM/Strength/Tone: WFL for tasks assessed LLE Sensation: WFL - Light Touch LLE Coordination: WFL - gross motor Trunk Assessment Trunk Assessment: Kyphotic   Balance Balance Balance Assessed: No  End of Session PT - End of Session Equipment Utilized During Treatment: Gait belt Activity Tolerance: Patient tolerated treatment well Patient left: in bed;with call bell/phone within reach;with bed alarm set Nurse Communication: Mobility status   Konrad Penta 12/07/2011, 1:11 PM

## 2011-12-08 LAB — CBC
MCH: 29.2 pg (ref 26.0–34.0)
MCHC: 32.4 g/dL (ref 30.0–36.0)
MCV: 90 fL (ref 78.0–100.0)
Platelets: 244 10*3/uL (ref 150–400)
RBC: 3.6 MIL/uL — ABNORMAL LOW (ref 3.87–5.11)
RDW: 14.8 % (ref 11.5–15.5)

## 2011-12-08 LAB — DIFFERENTIAL
Basophils Relative: 0 % (ref 0–1)
Eosinophils Absolute: 0.1 10*3/uL (ref 0.0–0.7)
Eosinophils Relative: 1 % (ref 0–5)
Neutrophils Relative %: 81 % — ABNORMAL HIGH (ref 43–77)

## 2011-12-08 NOTE — Progress Notes (Signed)
NAME:  [name], [name]                  ACCOUNT NO.:  [id number]  MEDICAL RECORD NO.:  [id number]  LOCATION:  A332                          FACILITY:  [facility]  PHYSICIAN:  Ivyonna Hoelzel G. Renard Matter, MD   DATE OF BIRTH:  [birth year]  DATE OF PROCEDURE: DATE OF DISCHARGE:                                PROGRESS NOTE   SUBJECTIVE:  This patient was admitted through the emergency department with dehydration. She has slightly elevated creatinine.  Her hemoglobin has dropped some since admission and is now 9.5 with hematocrit 30.2. Her chemistries remained normal.  She remains on intravenous fluid.  She was thought to have urinary tract infection and started on IV Rocephin. Her hemoglobin is now 9.5 with hematocrit 30.2.  OBJECTIVE:  VITAL SIGNS:  Blood pressure 126/80, respiration 20, temp 99. HEART:  Regular rhythm. LUNGS:  Clear to P and A. ABDOMEN:  No palpable organs or masses.  ASSESSMENT:  The patient admitted with dehydration.  Has urinary tract infections, slightly low hemoglobin.  PLAN:  To continue IV Rocephin.  We will address issue of slight anemia.     Ladawna Walgren G. Renard Matter, MD     AGM/MEDQ  D:  12/08/2011  T:  12/08/2011  Job:  [id number]

## 2011-12-09 LAB — URINE CULTURE: Culture  Setup Time: 201305160120

## 2011-12-09 NOTE — Discharge Summary (Signed)
NAME:  [name], [name]                  ACCOUNT NO.:  [id number]  MEDICAL RECORD NO.:  [id number]  LOCATION:  A332                          FACILITY:  [facility]  PHYSICIAN:  Jodie Leiner G. Renard Matter, MD   DATE OF BIRTH:  1941/03/21  DATE OF ADMISSION:  12/06/2011 DATE OF DISCHARGE:  LH                              DISCHARGE SUMMARY   This 71 year old white female was admitted on Dec 07, 2011, discharged Dec 10, 2011, 3 days hospitalization.  DIAGNOSES:  Dehydration, mild dementia, hypothyroidism, urinary tract infection, hypotension, depression.  The patient's condition stable at the time of her discharge.  This 70 year old female who has mild dementia had been becoming progressively weaker for several days prior to admission.  She had admission previously for dehydration and this seemed to be a similar episode.  She was brought in by her son to the ED where she was evaluated.  It was felt that she had mild urinary tract infection as well.  She was started on IV fluids and antibiotics.  Urine was obtained for culture.  PHYSICAL EXAMINATION:  VITAL SIGNS:  On admission, blood pressure 99/61, pulse 93, temp 97.7.  HEENT:  Eyes:  PERRLA.  TM negative.  Oropharynx benign. NECK:  Supple.  No JVD or thyroid abnormalities. HEART:  Regular rhythm.  No murmurs. LUNGS:  Clear to P and A. ABDOMEN:  No palpable organs or masses. EXTREMITIES:  Free of edema. SKIN:  Normal hydration.  No rash or ulcerations. NEUROLOGIC:  Cranial nerves II-XII intact.  No focal or lateralizing neurological deficits.  LABS ON ADMISSION:  CBC:  WBC 7800 with hemoglobin 11.2, hematocrit 35.4.  Neutrophils 78, lymphocytes 12.  Comprehensive metabolic panel: Sodium 141, potassium 3.9, chloride 107, CO2 24, glucose 117, BUN 27, creatinine 1.41, calcium 10.1, total protein 7.3, albumin 2.8, AST 46, ALT 28, alkaline phosphatase 130, GFR 37.  CK 45, CK-MB 2.3.  Troponin less than 0.30.  Lactic acid normal.  Urinalysis:  Positive  nitrites with WBC of 7-10, many bacteria.  Chest x-ray, no acute findings. Evidence of old left rib fractures.  HOSPITAL COURSE:  The patient was started on normal saline and IV Rocephin 1 g every 24 hours.  She was given Tessalon capsules 100 mg t.i.d., donepezil 5 mg daily, subcutaneous Lovenox 30 mg subcutaneously daily.  She is continued on levothyroxine 75 mcg daily, simvastatin 40 mg daily.  She is given following p.r.n. medications:  Acetaminophen 650 mg, Zofran 4 mg IV q.6 hours p.r.n. for nausea, trazodone 25 mg at bedtime p.r.n., senna/docusate 1 tablet p.r.n.  The patient remained fairly stable throughout the hospitalization.  At times she seemed to have episodes of unhappiness and would cry at times, but did not have any pain.  Urine culture is pending.  It was felt that after 3 days hospitalization, she could be discharged home.     Manases Etchison G. Renard Matter, MD     AGM/MEDQ  D:  12/09/2011  T:  12/09/2011  Job:  [id number]

## 2011-12-09 NOTE — Progress Notes (Signed)
NAME:  [name], [name]                  ACCOUNT NO.:  [id number]  MEDICAL RECORD NO.:  [id number]  LOCATION:  A332                          FACILITY:  [facility]  PHYSICIAN:  Abbee Cremeens G. Renard Matter, MD   DATE OF BIRTH:  01-06-1941  DATE OF PROCEDURE: DATE OF DISCHARGE:                                PROGRESS NOTE   This patient was admitted through ED with dehydration, slight elevation of creatinine, and slightly low hemoglobin.  She was thought to have urinary tract infection and has been receiving IV Rocephin.  OBJECTIVE:  VITAL SIGNS:  Blood pressure 110/71, respirations 18, pulse 72, and temp 97.6. HEART:  Regular rhythm. LUNGS:  Clear to P and A. ABDOMEN:  No palpable organs or masses.  The patient's hemoglobin now is 10.5, hematocrit 32.4.  Chemistries remain within normal range.  ASSESSMENT:  The patient was admitted with dehydration.  She has urinary tract infection, slightly low hemoglobin.  PLAN:  To continue IV Rocephin.  Stool for occult blood has not been reported.  Repeat CBC showed hemoglobin 10.5, hematocrit 32.4, normal MCV and MCHC.  Plan to attempt to discharge the patient in the a.m.     Tiondra Fang G. Renard Matter, MD     AGM/MEDQ  D:  12/09/2011  T:  12/09/2011  Job:  [id number]

## 2011-12-10 LAB — T4, FREE: Free T4: 1.58 ng/dL (ref 0.80–1.80)

## 2011-12-10 MED ORDER — SODIUM CHLORIDE 0.9 % IJ SOLN
INTRAMUSCULAR | Status: AC
  Filled 2011-12-10: qty 3

## 2011-12-10 NOTE — Discharge Summary (Signed)
NAME:  [name], [name]                  ACCOUNT NO.:  [id number]  MEDICAL RECORD NO.:  [id number]  LOCATION:  A332                          FACILITY:  [facility]  PHYSICIAN:  Anntoinette Haefele G. Renard Matter, MD   DATE OF BIRTH:  March 18, 1941  DATE OF ADMISSION:  12/06/2011 DATE OF DISCHARGE:  LH                              DISCHARGE SUMMARY   ADDENDUM  The following medications were to be continued at discharge: 1. Aricept 5 mg daily. 2. Levothyroxine 75 mcg 1 daily. 3. Benicar 40 mg daily. 4. Simvastatin 40 mg daily.  The patient was stable at the time of discharge.     Joyous Gleghorn G. Renard Matter, MD     AGM/MEDQ  D:  12/10/2011  T:  12/10/2011  Job:  [id number]

## 2011-12-10 NOTE — Discharge Summary (Signed)
NAME:  [name], [name]                  ACCOUNT NO.:  [id number]  MEDICAL RECORD NO.:  [id number]  LOCATION:  A332                          FACILITY:  [facility]  PHYSICIAN:  Beauden Tremont G. Renard Matter, MD   DATE OF BIRTH:  1940-09-27  DATE OF ADMISSION:  12/06/2011 DATE OF DISCHARGE:  05/19/2013LH                              DISCHARGE SUMMARY   ADDENDUM  This patient was sent home with a prescription for Cipro 500 mg b.i.d., this was to be picked up at local drug store.     Saylee Sherrill G. Renard Matter, MD     AGM/MEDQ  D:  12/10/2011  T:  12/10/2011  Job:  [id number]

## 2012-04-07 ENCOUNTER — Encounter (HOSPITAL_COMMUNITY): Payer: Self-pay | Admitting: Emergency Medicine

## 2012-04-07 ENCOUNTER — Emergency Department (HOSPITAL_COMMUNITY): Payer: Medicare Other

## 2012-04-07 ENCOUNTER — Inpatient Hospital Stay (HOSPITAL_COMMUNITY)
Admission: EM | Admit: 2012-04-07 | Discharge: 2012-04-10 | DRG: 056 | Disposition: A | Discharge status: Home / Self Care | Payer: Medicare Other | Attending: Family Medicine | Admitting: Family Medicine

## 2012-04-07 DIAGNOSIS — R471 Dysarthria and anarthria: Secondary | ICD-10-CM | POA: Diagnosis present

## 2012-04-07 DIAGNOSIS — G9340 Encephalopathy, unspecified: Secondary | ICD-10-CM | POA: Diagnosis present

## 2012-04-07 DIAGNOSIS — S40011A Contusion of right shoulder, initial encounter: Secondary | ICD-10-CM

## 2012-04-07 DIAGNOSIS — E876 Hypokalemia: Secondary | ICD-10-CM | POA: Diagnosis present

## 2012-04-07 DIAGNOSIS — F0390 Unspecified dementia without behavioral disturbance: Secondary | ICD-10-CM | POA: Diagnosis present

## 2012-04-07 DIAGNOSIS — N390 Urinary tract infection, site not specified: Secondary | ICD-10-CM | POA: Diagnosis present

## 2012-04-07 DIAGNOSIS — R569 Unspecified convulsions: Secondary | ICD-10-CM | POA: Diagnosis present

## 2012-04-07 DIAGNOSIS — I10 Essential (primary) hypertension: Secondary | ICD-10-CM | POA: Diagnosis present

## 2012-04-07 DIAGNOSIS — W19XXXA Unspecified fall, initial encounter: Secondary | ICD-10-CM

## 2012-04-07 DIAGNOSIS — I69998 Other sequelae following unspecified cerebrovascular disease: Principal | ICD-10-CM

## 2012-04-07 DIAGNOSIS — E785 Hyperlipidemia, unspecified: Secondary | ICD-10-CM | POA: Diagnosis present

## 2012-04-07 DIAGNOSIS — E860 Dehydration: Secondary | ICD-10-CM

## 2012-04-07 DIAGNOSIS — E039 Hypothyroidism, unspecified: Secondary | ICD-10-CM | POA: Diagnosis present

## 2012-04-07 DIAGNOSIS — G40309 Generalized idiopathic epilepsy and epileptic syndromes, not intractable, without status epilepticus: Secondary | ICD-10-CM | POA: Diagnosis present

## 2012-04-07 DIAGNOSIS — E440 Moderate protein-calorie malnutrition: Secondary | ICD-10-CM

## 2012-04-07 DIAGNOSIS — G9389 Other specified disorders of brain: Secondary | ICD-10-CM | POA: Diagnosis present

## 2012-04-07 LAB — URINALYSIS, ROUTINE W REFLEX MICROSCOPIC
Glucose, UA: NEGATIVE mg/dL
Ketones, ur: NEGATIVE mg/dL
Leukocytes, UA: NEGATIVE
Specific Gravity, Urine: 1.02 (ref 1.005–1.030)
pH: 6 (ref 5.0–8.0)

## 2012-04-07 LAB — TROPONIN I: Troponin I: <0.3 ng/mL (ref <0.30)

## 2012-04-07 LAB — COMPREHENSIVE METABOLIC PANEL
AST: 20 U/L (ref 0–37)
CO2: 23 mEq/L (ref 19–32)
Chloride: 98 mEq/L (ref 96–112)
Creatinine, Ser: 1.07 mg/dL (ref 0.50–1.10)
GFR calc non Af Amer: 51 mL/min — ABNORMAL LOW (ref >90)
Glucose, Bld: 132 mg/dL — ABNORMAL HIGH (ref 70–99)
Total Bilirubin: 0.3 mg/dL (ref 0.3–1.2)

## 2012-04-07 LAB — CBC WITH DIFFERENTIAL/PLATELET
Basophils Absolute: 0 10*3/uL (ref 0.0–0.1)
Eosinophils Relative: 1 % (ref 0–5)
HCT: 40 % (ref 36.0–46.0)
Hemoglobin: 12.9 g/dL (ref 12.0–15.0)
Lymphocytes Relative: 6 % — ABNORMAL LOW (ref 12–46)
Lymphs Abs: 0.9 10*3/uL (ref 0.7–4.0)
MCV: 91.5 fL (ref 78.0–100.0)
Monocytes Absolute: 1 10*3/uL (ref 0.1–1.0)
Monocytes Relative: 8 % (ref 3–12)
Neutro Abs: 11.6 10*3/uL — ABNORMAL HIGH (ref 1.7–7.7)
RBC: 4.37 MIL/uL (ref 3.87–5.11)
RDW: 15.7 % — ABNORMAL HIGH (ref 11.5–15.5)
WBC: 13.7 10*3/uL — ABNORMAL HIGH (ref 4.0–10.5)

## 2012-04-07 LAB — URINE MICROSCOPIC-ADD ON

## 2012-04-07 NOTE — ED Notes (Signed)
Family told EMS that patient was sitting at kitchen table and when they came back in the room, she was lying in the floor.

## 2012-04-07 NOTE — ED Provider Notes (Signed)
History  This chart was scribed for Ward Givens, MD by Erskine Emery. This patient was seen in room APA06/[facility]06 and the patient's care was started at 20:46.   CSN: [id number]  Arrival date & time 04/07/12  2031   First MD Initiated Contact with Patient 04/07/12 2046     Level 5 Caveat--Dementia  Chief Complaint  Patient presents with  . Fall    (Consider location/radiation/quality/duration/timing/severity/associated sxs/prior Treatment) Level 5 Caveat--Dementia The history is provided by the patient and the EMS personnel. The history is limited by the condition of the patient. No language interpreter was used.  [name] is a 71 y.o. female brought in by ambulance, who presents to the Emergency Department complaining of a fall. EMS report the family said she was sitting at the kitchen table and when they came back into the room she was lying on the floor. Pt doesn't appear to have any pain or injury.   PCP Dr Renard Matter  Past Medical History  Diagnosis Date  . Hyperlipemia   . Hypothyroid   . Dementia 12/07/2011  . UTI (lower urinary tract infection) 12/07/2011    History reviewed. No pertinent past surgical history.  No family history on file.  History  Substance Use Topics  . Smoking status: Never Smoker   . Smokeless tobacco: Not on file  . Alcohol Use: No    OB History    Grav Para Term Preterm Abortions TAB SAB Ect Mult Living                  Review of Systems  Unable to perform ROS: Dementia    Allergies  Review of patient's allergies indicates no known allergies.  Home Medications   Current Outpatient Rx  Name Route Sig Dispense Refill  . DONEPEZIL HCL 5 MG PO TABS Oral Take 5 mg by mouth daily.    Marland Kitchen LEVOTHYROXINE SODIUM 75 MCG PO TABS Oral Take 75 mcg by mouth daily.    Marland Kitchen OLMESARTAN MEDOXOMIL 40 MG PO TABS Oral Take 40 mg by mouth daily.    . SERTRALINE HCL 50 MG PO TABS Oral Take 50 mg by mouth at bedtime.    Marland Kitchen SIMVASTATIN 40 MG PO TABS Oral Take  40 mg by mouth every evening.      Triage Vitals: BP 159/80  Pulse 68  Temp 97.3 F (36.3 C) (Oral)  Resp 16  Ht 5\' 5"  (1.651 m)  Wt 90 lb (40.824 kg)  BMI 14.98 kg/m2  SpO2 94%  Vital signs normal    Physical Exam  Nursing note and vitals reviewed. Constitutional:  Non-toxic appearance. She does not appear ill. No distress.       Dried stool/dirt on feet and hands. Clothes are dirty and the pt appears ill-kept.She is very thin and underweight  HENT:  Head: Normocephalic and atraumatic.  Nose: Nose normal. No mucosal edema or rhinorrhea.  Mouth/Throat: Oropharynx is clear and moist and mucous membranes are normal. No dental abscesses or uvula swelling.       No obsious bruising to head.  Eyes: Conjunctivae normal and EOM are normal.  Neck: Normal range of motion and full passive range of motion without pain. Neck supple.  Cardiovascular: Normal rate, regular rhythm and normal heart sounds.  Exam reveals no gallop and no friction rub.   No murmur heard. Pulmonary/Chest: Effort normal and breath sounds normal. No respiratory distress. She has no wheezes. She has no rhonchi. She has no rales. She exhibits  no tenderness and no crepitus.       Lungs are clear.  Abdominal: Soft. Normal appearance and bowel sounds are normal. She exhibits no distension. There is no tenderness. There is no rebound and no guarding.  Musculoskeletal: Normal range of motion. She exhibits no edema and no tenderness.       Moves all extremities well without pain.  Neurological: She is alert. She has normal strength. No cranial nerve deficit.       Pt has hard to understand speech, then sometimes she will say something that is easy to understand  Skin: Skin is warm, dry and intact. No rash noted. No erythema. No pallor.  Psychiatric: She has a normal mood and affect. Her speech is normal and behavior is normal. Her mood appears not anxious.       cooperative    ED Course  Procedures (including critical  care time)   Medications  cephALEXin (KEFLEX) 500 MG capsule (not administered)  potassium chloride SA (K-DUR,KLOR-CON) 20 MEQ tablet (not administered)    DIAGNOSTIC STUDIES: Oxygen Saturation is 94% on room air, adequate by my interpretation.    COORDINATION OF CARE: 21:10--I evaluated the patient.  Recheck after labs, son here now. States patient was eating at the kitchen table by herself which she usually does and when they went back into the kitchen she was on the floor. She now has bruising and swelling over her right clavicle/shoulder which wasn't there before. Xrays added. No bruising noted to her head.   01:10 Pt had a generalized seizure, pt resolved prior to getting meds. Pt is starting to talk at 01:20  Family states she hasn't had a seizure before. Will get head CT. Dr Dierdre Highman will arrange admission (at change of shift). Pt started on IV rocephin for her UTI and keppra for her seizure  Results for orders placed during the hospital encounter of 04/07/12  CBC WITH DIFFERENTIAL      Component Value Range   WBC 13.7 (*) 4.0 - 10.5 K/uL   RBC 4.37  3.87 - 5.11 MIL/uL   Hemoglobin 12.9  12.0 - 15.0 g/dL   HCT 27.2  53.6 - 64.4 %   MCV 91.5  78.0 - 100.0 fL   MCH 29.5  26.0 - 34.0 pg   MCHC 32.3  30.0 - 36.0 g/dL   RDW 03.4 (*) 74.2 - 59.5 %   Platelets 266  150 - 400 K/uL   Neutrophils Relative 85 (*) 43 - 77 %   Neutro Abs 11.6 (*) 1.7 - 7.7 K/uL   Lymphocytes Relative 6 (*) 12 - 46 %   Lymphs Abs 0.9  0.7 - 4.0 K/uL   Monocytes Relative 8  3 - 12 %   Monocytes Absolute 1.0  0.1 - 1.0 K/uL   Eosinophils Relative 1  0 - 5 %   Eosinophils Absolute 0.2  0.0 - 0.7 K/uL   Basophils Relative 0  0 - 1 %   Basophils Absolute 0.0  0.0 - 0.1 K/uL  COMPREHENSIVE METABOLIC PANEL      Component Value Range   Sodium 135  135 - 145 mEq/L   Potassium 2.7 (*) 3.5 - 5.1 mEq/L   Chloride 98  96 - 112 mEq/L   CO2 23  19 - 32 mEq/L   Glucose, Bld 132 (*) 70 - 99 mg/dL   BUN 17  6 - 23  mg/dL   Creatinine, Ser 6.38  0.50 - 1.10 mg/dL   Calcium  9.8  8.4 - 10.5 mg/dL   Total Protein 7.8  6.0 - 8.3 g/dL   Albumin 3.4 (*) 3.5 - 5.2 g/dL   AST 20  0 - 37 U/L   ALT 9  0 - 35 U/L   Alkaline Phosphatase 135 (*) 39 - 117 U/L   Total Bilirubin 0.3  0.3 - 1.2 mg/dL   GFR calc non Af Amer 51 (*) >90 mL/min   GFR calc Af Amer 59 (*) >90 mL/min  URINALYSIS, ROUTINE W REFLEX MICROSCOPIC      Component Value Range   Color, Urine YELLOW  YELLOW   APPearance HAZY (*) CLEAR   Specific Gravity, Urine 1.020  1.005 - 1.030   pH 6.0  5.0 - 8.0   Glucose, UA NEGATIVE  NEGATIVE mg/dL   Hgb urine dipstick SMALL (*) NEGATIVE   Bilirubin Urine NEGATIVE  NEGATIVE   Ketones, ur NEGATIVE  NEGATIVE mg/dL   Protein, ur NEGATIVE  NEGATIVE mg/dL   Urobilinogen, UA 0.2  0.0 - 1.0 mg/dL   Nitrite POSITIVE (*) NEGATIVE   Leukocytes, UA NEGATIVE  NEGATIVE  TROPONIN I      Component Value Range   Troponin I <0.30  <0.30 ng/mL  URINE MICROSCOPIC-ADD ON      Component Value Range   WBC, UA 11-20  <3 WBC/hpf   RBC / HPF 3-6  <3 RBC/hpf   Bacteria, UA MANY (*) RARE   Laboratory interpretation all normal except for leukocytosis, hypokalemia, possible urinary tract infection   Dg Chest Portable 1 View  04/07/2012  *RADIOLOGY REPORT*  Clinical Data: Status post fall.  PORTABLE CHEST - 1 VIEW  Comparison: Plain films of the chest 01/03/2011 and 12/06/2011.  Findings: The lungs are clear.  No pneumothorax or pleural fluid. Heart size normal.  No focal bony abnormality.  IMPRESSION: No acute disease.   Original Report Authenticated By: Bernadene Bell. Maricela Curet, M.D.     Date: 04/07/2012  Rate: 67  Rhythm: normal sinus rhythm  QRS Axis: normal  Intervals: normal  ST/T Wave abnormalities: nonspecific T wave changes  Conduction Disutrbances:LVH  Narrative Interpretation:   Old EKG Reviewed: unchanged from 12/06/2011 except HR was 96     1. Fall   2. UTI (lower urinary tract infection)   3. Hypokalemia    4. Contusion of shoulder, right   5. Seizure generalized   New Prescriptions   CEPHALEXIN (KEFLEX) 500 MG CAPSULE    Take 1 capsule (500 mg total) by mouth 3 (three) times daily.   POTASSIUM CHLORIDE SA (K-DUR,KLOR-CON) 20 MEQ TABLET    Take 1 tablet (20 mEq total) by mouth 2 (two) times daily.    Plan discharge changed to admission  CT head pending.   Devoria Albe, MD, FACEP    MDM  I personally performed the services described in this documentation, which was scribed in my presence. The recorded information has been reviewed and considered.  Devoria Albe, MD, FACEP    Ward Givens, MD 04/08/12 Jacinta Shoe  Ward Givens, MD 04/16/12 2113

## 2012-04-08 ENCOUNTER — Inpatient Hospital Stay (HOSPITAL_COMMUNITY)
Admit: 2012-04-08 | Discharge: 2012-04-08 | Disposition: A | Discharge status: Home / Self Care | Payer: Medicare Other | Attending: Neurology | Admitting: Neurology

## 2012-04-08 ENCOUNTER — Inpatient Hospital Stay (HOSPITAL_COMMUNITY): Payer: Medicare Other

## 2012-04-08 ENCOUNTER — Emergency Department (HOSPITAL_COMMUNITY): Payer: Medicare Other

## 2012-04-08 DIAGNOSIS — N390 Urinary tract infection, site not specified: Secondary | ICD-10-CM

## 2012-04-08 DIAGNOSIS — R569 Unspecified convulsions: Secondary | ICD-10-CM | POA: Diagnosis present

## 2012-04-08 DIAGNOSIS — E860 Dehydration: Secondary | ICD-10-CM

## 2012-04-08 DIAGNOSIS — E876 Hypokalemia: Secondary | ICD-10-CM | POA: Diagnosis present

## 2012-04-08 DIAGNOSIS — F0390 Unspecified dementia without behavioral disturbance: Secondary | ICD-10-CM

## 2012-04-08 LAB — VITAMIN B12: Vitamin B-12: 400 pg/mL (ref 211–911)

## 2012-04-08 LAB — RPR: RPR Ser Ql: NONREACTIVE

## 2012-04-08 LAB — HOMOCYSTEINE: Homocysteine: 21 umol/L — ABNORMAL HIGH (ref 4.0–15.4)

## 2012-04-08 LAB — PROCALCITONIN: Procalcitonin: <0.1 ng/mL

## 2012-04-08 MED ORDER — SODIUM CHLORIDE 0.9 % IV SOLN: INTRAVENOUS | Status: DC

## 2012-04-08 MED ORDER — SODIUM CHLORIDE 0.9 % IV SOLN
500.0000 mg | Freq: Two times a day (BID) | INTRAVENOUS | Status: DC
  Filled 2012-04-08: qty 5

## 2012-04-08 MED ORDER — SODIUM CHLORIDE 0.9 % IV SOLN
1000.0000 mg | Freq: Once | INTRAVENOUS | Status: AC
  Administered 2012-04-08: 1000 mg via INTRAVENOUS
  Filled 2012-04-08: qty 10

## 2012-04-08 MED ORDER — POTASSIUM CHLORIDE CRYS ER 20 MEQ PO TBCR: 20.0000 meq | EXTENDED_RELEASE_TABLET | Freq: Two times a day (BID) | ORAL | Status: DC

## 2012-04-08 MED ORDER — SODIUM CHLORIDE 0.9 % IV SOLN
1000.0000 mg | INTRAVENOUS | Status: DC
  Filled 2012-04-08: qty 10

## 2012-04-08 MED ORDER — LEVOTHYROXINE SODIUM 75 MCG PO TABS
75.0000 ug | ORAL_TABLET | Freq: Every day | ORAL | Status: DC
  Administered 2012-04-08: 75 ug via ORAL
  Filled 2012-04-08: qty 1

## 2012-04-08 MED ORDER — DEXTROSE 5 % IV SOLN
1.0000 g | INTRAVENOUS | Status: DC
  Administered 2012-04-08 – 2012-04-09 (×2): 1 g via INTRAVENOUS
  Filled 2012-04-08 (×3): qty 10

## 2012-04-08 MED ORDER — LEVETIRACETAM 500 MG/5ML IV SOLN
INTRAVENOUS | Status: AC
  Filled 2012-04-08: qty 5

## 2012-04-08 MED ORDER — POTASSIUM CHLORIDE IN NACL 20-0.9 MEQ/L-% IV SOLN
INTRAVENOUS | Status: AC
  Administered 2012-04-08: 06:00:00 via INTRAVENOUS

## 2012-04-08 MED ORDER — POTASSIUM CHLORIDE CRYS ER 20 MEQ PO TBCR
40.0000 meq | EXTENDED_RELEASE_TABLET | Freq: Once | ORAL | Status: AC
  Administered 2012-04-08: 40 meq via ORAL
  Filled 2012-04-08: qty 2

## 2012-04-08 MED ORDER — CEFTRIAXONE SODIUM 1 G IJ SOLR
1.0000 g | Freq: Once | INTRAMUSCULAR | Status: AC
  Administered 2012-04-08: 1 g via INTRAVENOUS
  Filled 2012-04-08: qty 10

## 2012-04-08 MED ORDER — LORAZEPAM 2 MG/ML IJ SOLN
INTRAMUSCULAR | Status: AC
  Administered 2012-04-08: 2 mg
  Filled 2012-04-08: qty 1

## 2012-04-08 MED ORDER — CEPHALEXIN 500 MG PO CAPS: 500.0000 mg | ORAL_CAPSULE | Freq: Three times a day (TID) | ORAL | Status: AC

## 2012-04-08 MED ORDER — BIOTENE DRY MOUTH MT LIQD
15.0000 mL | Freq: Two times a day (BID) | OROMUCOSAL | Status: DC
  Administered 2012-04-08 – 2012-04-10 (×4): 15 mL via OROMUCOSAL

## 2012-04-08 MED ORDER — GADOBENATE DIMEGLUMINE 529 MG/ML IV SOLN
7.0000 mL | Freq: Once | INTRAVENOUS | Status: AC | PRN
  Administered 2012-04-08: 7 mL via INTRAVENOUS

## 2012-04-08 MED ORDER — SERTRALINE HCL 50 MG PO TABS
50.0000 mg | ORAL_TABLET | Freq: Every day | ORAL | Status: DC
  Administered 2012-04-08 – 2012-04-09 (×2): 50 mg via ORAL
  Filled 2012-04-08 (×2): qty 1

## 2012-04-08 MED ORDER — SODIUM CHLORIDE 0.9 % IV SOLN
250.0000 mg | Freq: Two times a day (BID) | INTRAVENOUS | Status: DC
  Administered 2012-04-08 – 2012-04-09 (×3): 250 mg via INTRAVENOUS
  Filled 2012-04-08 (×5): qty 2.5

## 2012-04-08 MED ORDER — CEPHALEXIN 500 MG PO CAPS
500.0000 mg | ORAL_CAPSULE | Freq: Once | ORAL | Status: AC
  Administered 2012-04-08: 500 mg via ORAL
  Filled 2012-04-08: qty 1

## 2012-04-08 MED ORDER — SIMVASTATIN 20 MG PO TABS
40.0000 mg | ORAL_TABLET | Freq: Every evening | ORAL | Status: DC
  Administered 2012-04-08 – 2012-04-09 (×2): 40 mg via ORAL
  Filled 2012-04-08 (×2): qty 2

## 2012-04-08 MED ORDER — IRBESARTAN 75 MG PO TABS
75.0000 mg | ORAL_TABLET | Freq: Every day | ORAL | Status: DC
  Administered 2012-04-08 – 2012-04-10 (×3): 75 mg via ORAL
  Filled 2012-04-08 (×3): qty 1

## 2012-04-08 MED ORDER — LORAZEPAM 2 MG/ML IJ SOLN: 1.0000 mg | Freq: Once | INTRAMUSCULAR | Status: DC

## 2012-04-08 NOTE — Progress Notes (Signed)
Pt's eyes draining yellowish green drainage and pt's eyes are matted together at times.  Will report to night nurse so that Dr. Renard Matter can look at pt's eyes.

## 2012-04-08 NOTE — Consult Note (Signed)
[facility] Orvell Careaga A. Gerilyn Pilgrim, MD     [email or web address]        The patient is a 71 year old lady who is  apparently was found on the floor. Since it was assumed that the patient fell. The patient was taken to the emergency room where she was noticed to have a witnessed generalized grand mal seizure. She has a baseline history of dementia although she resides at home. The grandson is present during the evaluation and does report a family members concerned about the patient's short-term memory impairment. The grandson however cannot really corroborate a history of short-term memory. As the does not live in the area. The patient's workup was significant for UTI. She is on appropriate antibiotics. She has been started on IV Dilantin. 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 GENERAL: The patient is dosing but is in no acute distress.  HEENT: Supple. Atraumatic normocephalic.   ABDOMEN: soft  EXTREMITIES: No edema   BACK: Normal.  SKIN: Normal by inspection.    MENTAL STATUS: The patient is quite stuporous. She does open her eyes to deep painful stimuli but only partially. She does not follow commands. She words a couple of 2 word incomprehensible sentences. She falls back to sleep quickly if not stimulated.  CRANIAL NERVES: Pupils are equal, round and reactive to light; oculocephalic reflexes are intact; corneal reflexes are intact although slightly diminished bilaterally; upper and lower facial muscles are normal in strength and symmetric, there is no flattening of the nasolabial folds.  MOTOR: The patient seemed to have normal tone and bulk involving all extremities except the left upper extremity. She has antigravity strength to the pain was July throughout. She seemed to have a wrist drop with some increased tone involving the left upper extremity.  COORDINATION: There is no dysmetria observed. There is no rigidity or parkinsonism. No tremors.  REFLEXES: Deep tendon reflexes are  symmetrical and normal. Babinski reflexes are flexor bilaterally.   SENSATION: She responds to deep painful stimuli bilaterally.   Head CT scan is reviewed in person and shows generalized atrophy and chronic white matter ischemic disease. There is a large encephalomalacia involving the left occipital area essentially approximating the left  PCA.  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 EEG  Altered mental status due to medication effect and UTI. I would not explain the patient's post ictal lethargy to last this long. I will therefore reduce the dose of the Keppra to 250 twice a day which is a more appropriate dose given her age.  Baseline cognitive impairment/dementia. Atypical dementia workup will be initiated.

## 2012-04-08 NOTE — Progress Notes (Signed)
Subjective: This patient is resting quietly. She was admitted through the emergency room. She was admitted there following a fall at home. And she had a seizure while in the emergency room. She is resting now without further seizure activity.   Objective: Vital signs in last 24 hours: Temp:  [97.3 F (36.3 C)-99.1 F (37.3 C)] 99.1 F (37.3 C) (09/16 0101) Pulse Rate:  [68-101] 87  (09/16 0341) Resp:  [16] 16  (09/15 2036) BP: (125-221)/(75-95) 125/75 mmHg (09/16 0341) SpO2:  [92 %-100 %] 100 % (09/16 0341) Weight:  [40.1 kg (88 lb 6.5 oz)-40.824 kg (90 lb)] 40.1 kg (88 lb 6.5 oz) (09/16 0341) Weight change:     Intake/Output from previous day:   Intake/Output this shift:    Physical Exam: BP 125/75 pulse rate 87 respirations 16 H. EENT eyes PERRLA TM negative neck supple no JVD or thyroid abnormality Lungs clear to P&A heart regular rhythm no murmurs Abdomen soft no palpable organs or masses  extremities free of edema Neurological cranial nerves intact no sensory or motor abnormalities no abnormal reflexes  Studies/Results: Dg Clavicle Right  04/08/2012  *RADIOLOGY REPORT*  Clinical Data: Fall, pain.  RIGHT CLAVICLE - 2+ VIEWS  Comparison: None.  Findings: Positioning is nonstandard.  No acute bony or joint abnormality is identified.  IMPRESSION: Limited study demonstrating no acute abnormality.   Original Report Authenticated By: Bernadene Bell. Maricela Curet, M.D.    Dg Shoulder Right  04/08/2012  *RADIOLOGY REPORT*  Clinical Data: Fall.  Right shoulder pain.  RIGHT SHOULDER - 2+ VIEW  Comparison: Plain films of the chest 01/03/2011.  Findings: Positioning is nonstandard.  No fracture or dislocation is identified.  The acromioclavicular joint appears intact. Mid thoracic spine compression fracture is identified and is seen on the prior plain films of the chest.  IMPRESSION: Limited study demonstrating no acute abnormality.   Original Report Authenticated By: Bernadene Bell. Maricela Curet, M.D.     Ct Head Wo Contrast  04/08/2012  *RADIOLOGY REPORT*  Clinical Data: Fall.  Confusion.  CT HEAD WITHOUT CONTRAST  Technique:  Contiguous axial images were obtained from the base of the skull through the vertex without contrast.  Comparison: Brain MRI 12/20/2005 and head CT scan 11/29/2005.  Findings: Atrophy, chronic microvascular ischemic change and remote left PCA territory infarct are again seen.  No evidence of acute abnormality including infarction, hemorrhage, mass lesion, mass effect, midline shift or abnormal extra-axial fluid collection. Chronic left maxillary sinus disease is noted.  Small amount of fluid is present in the mastoid air cells.  IMPRESSION: No acute finding.   Original Report Authenticated By: Bernadene Bell. D'ALESSIO, M.D.    Dg Chest Portable 1 View  04/07/2012  *RADIOLOGY REPORT*  Clinical Data: Status post fall.  PORTABLE CHEST - 1 VIEW  Comparison: Plain films of the chest 01/03/2011 and 12/06/2011.  Findings: The lungs are clear.  No pneumothorax or pleural fluid. Heart size normal.  No focal bony abnormality.  IMPRESSION: No acute disease.   Original Report Authenticated By: Bernadene Bell. Maricela Curet, M.D.     Medications:     . cefTRIAXone (ROCEPHIN)  IV  1 [name] Intravenous Once  . cefTRIAXone (ROCEPHIN)  IV  1 [name] Intravenous Q24H  . cephALEXin  500 mg Oral Once  . irbesartan  75 mg Oral Daily  . levetiracetam  1,000 mg Intravenous Once  . levetiracetam  1,000 mg Intravenous Q24H  . levothyroxine  75 mcg Oral QAC breakfast  . LORazepam      .  potassium chloride SA  40 mEq Oral Once  . sertraline  50 mg Oral QHS  . simvastatin  40 mg Oral QPM  . DISCONTD: sodium chloride   Intravenous STAT  . DISCONTD: LORazepam  1 mg Intravenous Once       . 0.9 % NaCl with KCl 20 mEq / L 75 mL/hr at 04/08/12 0865     this patient was admitted with new onset seizure activity  Assessment/Plan: she has urinary tract infection, also she has a history of dementia hypothyroidism and prior  history of  she also has a history of hypokinemia.   plan to obtain neurology consult, and continue current IV antibiotics and IV keppra.  LOS: 1 day   [name] 04/08/2012, 6:29 AM

## 2012-04-08 NOTE — Progress Notes (Signed)
Pt out of room for MRI.  Unable to do PT eval.  Will try again tomorrow.

## 2012-04-08 NOTE — Progress Notes (Signed)
EEG completed as ordered at bedside °
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
## 2012-04-08 NOTE — ED Notes (Signed)
Discharge instructions given to family member. Pt denies pain at this time.
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
## 2012-04-08 NOTE — Progress Notes (Signed)
Pt is alert and oriented to self; answering questions appropriately.  Pt asking for food and drink; able to swallow without any problems.  Pt given an Ensure and Coke and she was able to drink all of both of them without any problems.

## 2012-04-08 NOTE — H&P (Signed)
PCP:   Alice Reichert, MD   Chief Complaint:  Found down  HPI: 71 yo female demented lives at home with family found down at table so brought to ED for evaluation.  Found to have uti then while in ED had a witnessed seizure.  Currently postictal.  Family did not witness any seizure activity but likely had when she was found down on floor confused.  No fevers.  No recent illnesses per son no n/v/d.  No complaints of cp or abd pain.  No previous h/o seizure activity.  No recent wt loss.  Family has not noticed any neuro deficits in patient recently.  Review of Systems:  Ow unobtainable  Past Medical History: Past Medical History  Diagnosis Date  . Hyperlipemia   . Hypothyroid   . Dementia 12/07/2011  . UTI (lower urinary tract infection) 12/07/2011   History reviewed. No pertinent past surgical history.  Medications: Prior to Admission medications   Medication Sig Start Date End Date Taking? Authorizing Provider  donepezil (ARICEPT) 5 MG tablet Take 5 mg by mouth daily.   Yes Historical Provider, MD  levothyroxine (SYNTHROID, LEVOTHROID) 75 MCG tablet Take 75 mcg by mouth daily.   Yes Historical Provider, MD  olmesartan (BENICAR) 40 MG tablet Take 40 mg by mouth daily.   Yes Historical Provider, MD  sertraline (ZOLOFT) 50 MG tablet Take 50 mg by mouth at bedtime.   Yes Historical Provider, MD  simvastatin (ZOCOR) 40 MG tablet Take 40 mg by mouth every evening.   Yes Historical Provider, MD  cephALEXin (KEFLEX) 500 MG capsule Take 1 capsule (500 mg total) by mouth 3 (three) times daily. 04/08/12 04/18/12  Ward Givens, MD  potassium chloride SA (K-DUR,KLOR-CON) 20 MEQ tablet Take 1 tablet (20 mEq total) by mouth 2 (two) times daily. 04/08/12 04/08/13  Ward Givens, MD    Allergies:  No Known Allergies  Social History:  reports that she has never smoked. She does not have any smokeless tobacco history on file. She reports that she does not drink alcohol or use illicit drugs.  Physical  Exam: Filed Vitals:   04/07/12 0159 04/07/12 2036 04/08/12 0101  BP: 132/96 159/80 221/95  Pulse: 81 68 101  Temp:  97.3 F (36.3 C) 99.1 F (37.3 C)  TempSrc:  Oral Rectal  Resp: 21 16   Height:  5\' 5"  (1.651 m)   Weight:  40.824 kg (90 lb)   SpO2: 94% 94% 92%   General appearance: cooperative and no distress chronically ill appearing Lungs: clear to auscultation bilaterally Heart: regular rate and rhythm, S1, S2 normal, no murmur, click, rub or gallop Abdomen: soft, non-tender; bowel sounds normal; no masses,  no organomegaly Extremities: extremities normal, atraumatic, no cyanosis or edema Pulses: 2+ and symmetric Skin: Skin color, texture, turgor normal. No rashes or lesions Neurologic: Grossly normal  Sedated ?postictal no focal def.    Labs on Admission:   [facility] 04/07/12 2205  NA 135  K 2.7*  CL 98  CO2 23  GLUCOSE 132*  BUN 17  CREATININE 1.07  CALCIUM 9.8  MG --  PHOS --    Basename 04/07/12 2205  AST 20  ALT 9  ALKPHOS 135*  BILITOT 0.3  PROT 7.8  ALBUMIN 3.4*    Basename 04/07/12 2205  WBC 13.7*  NEUTROABS 11.6*  HGB 12.9  HCT 40.0  MCV 91.5  PLT 266    Basename 04/07/12 2205  CKTOTAL --  CKMB --  CKMBINDEX --  TROPONINI <0.30   Radiological Exams on Admission: Dg Clavicle Right  04/08/2012  *RADIOLOGY REPORT*  Clinical Data: Fall, pain.  RIGHT CLAVICLE - 2+ VIEWS  Comparison: None.  Findings: Positioning is nonstandard.  No acute bony or joint abnormality is identified.  IMPRESSION: Limited study demonstrating no acute abnormality.   Original Report Authenticated By: Bernadene Bell. Maricela Curet, M.D.    Dg Shoulder Right  04/08/2012  *RADIOLOGY REPORT*  Clinical Data: Fall.  Right shoulder pain.  RIGHT SHOULDER - 2+ VIEW  Comparison: Plain films of the chest 01/03/2011.  Findings: Positioning is nonstandard.  No fracture or dislocation is identified.  The acromioclavicular joint appears intact. Mid thoracic spine compression fracture is  identified and is seen on the prior plain films of the chest.  IMPRESSION: Limited study demonstrating no acute abnormality.   Original Report Authenticated By: Bernadene Bell. Maricela Curet, M.D.    Ct Head Wo Contrast  04/08/2012  *RADIOLOGY REPORT*  Clinical Data: Fall.  Confusion.  CT HEAD WITHOUT CONTRAST  Technique:  Contiguous axial images were obtained from the base of the skull through the vertex without contrast.  Comparison: Brain MRI 12/20/2005 and head CT scan 11/29/2005.  Findings: Atrophy, chronic microvascular ischemic change and remote left PCA territory infarct are again seen.  No evidence of acute abnormality including infarction, hemorrhage, mass lesion, mass effect, midline shift or abnormal extra-axial fluid collection. Chronic left maxillary sinus disease is noted.  Small amount of fluid is present in the mastoid air cells.  IMPRESSION: No acute finding.   Original Report Authenticated By: Bernadene Bell. D'ALESSIO, M.D.    Dg Chest Portable 1 View  04/07/2012  *RADIOLOGY REPORT*  Clinical Data: Status post fall.  PORTABLE CHEST - 1 VIEW  Comparison: Plain films of the chest 01/03/2011 and 12/06/2011.  Findings: The lungs are clear.  No pneumothorax or pleural fluid. Heart size normal.  No focal bony abnormality.  IMPRESSION: No acute disease.   Original Report Authenticated By: Bernadene Bell. Maricela Curet, M.D.     Assessment/Plan Present on Admission:  71 yo female with new onset seizure .Seizure .UTI (lower urinary tract infection) .Dementia .HTN (hypertension) .Hypothyroidism .Hypokalemia  Will obtain mri and neuro consult.  Rocephin for uti.  Got keppra in ED.  pcp dr Renard Matter.  DAVID,RACHAL A [phone] 04/08/2012, 3:02 AM

## 2012-04-08 NOTE — Progress Notes (Signed)
UR Chart Review Completed  

## 2012-04-08 NOTE — Consult Note (Signed)
Reason for Consult: Referring Physician:   CHEYRL [name] is an 71 y.o. female.  HPI:   Past Medical History  Diagnosis Date  . Hyperlipemia   . Hypothyroid   . Dementia 12/07/2011  . UTI (lower urinary tract infection) 12/07/2011    History reviewed. No pertinent past surgical history.  No family history on file.  Social History:  reports that she has never smoked. She does not have any smokeless tobacco history on file. She reports that she does not drink alcohol or use illicit drugs.  Allergies: No Known Allergies  Medications:  Scheduled Meds:   . cefTRIAXone (ROCEPHIN)  IV  1 g Intravenous Once  . cefTRIAXone (ROCEPHIN)  IV  1 g Intravenous Q24H  . cephALEXin  500 mg Oral Once  . irbesartan  75 mg Oral Daily  . levetiracetam  1,000 mg Intravenous Once  . levetiracetam  500 mg Intravenous Q12H  . levothyroxine  75 mcg Oral QAC breakfast  . LORazepam      . potassium chloride SA  40 mEq Oral Once  . sertraline  50 mg Oral QHS  . simvastatin  40 mg Oral QPM  . DISCONTD: sodium chloride   Intravenous STAT  . DISCONTD: levetiracetam  1,000 mg Intravenous Q24H  . DISCONTD: LORazepam  1 mg Intravenous Once   Continuous Infusions:   . 0.9 % NaCl with KCl 20 mEq / L 75 mL/hr at 04/08/12 0555   PRN Meds:.   No current facility-administered medications on file prior to encounter.   Current Outpatient Prescriptions on File Prior to Encounter  Medication Sig Dispense Refill  . donepezil (ARICEPT) 5 MG tablet Take 5 mg by mouth daily.      Marland Kitchen levothyroxine (SYNTHROID, LEVOTHROID) 75 MCG tablet Take 75 mcg by mouth daily.      Marland Kitchen olmesartan (BENICAR) 40 MG tablet Take 40 mg by mouth daily.      . sertraline (ZOLOFT) 50 MG tablet Take 50 mg by mouth at bedtime.      . simvastatin (ZOCOR) 40 MG tablet Take 40 mg by mouth every evening.      . potassium chloride SA (K-DUR,KLOR-CON) 20 MEQ tablet Take 1 tablet (20 mEq total) by mouth 2 (two) times daily.  8 tablet  0      Results for orders placed during the hospital encounter of 04/07/12 (from the past 48 hour(s))  URINALYSIS, ROUTINE W REFLEX MICROSCOPIC     Status: Abnormal   Collection Time   04/07/12 10:04 PM      Component Value Range Comment   Color, Urine YELLOW  YELLOW    APPearance HAZY (*) CLEAR    Specific Gravity, Urine 1.020  1.005 - 1.030    pH 6.0  5.0 - 8.0    Glucose, UA NEGATIVE  NEGATIVE mg/dL    Hgb urine dipstick SMALL (*) NEGATIVE    Bilirubin Urine NEGATIVE  NEGATIVE    Ketones, ur NEGATIVE  NEGATIVE mg/dL    Protein, ur NEGATIVE  NEGATIVE mg/dL    Urobilinogen, UA 0.2  0.0 - 1.0 mg/dL    Nitrite POSITIVE (*) NEGATIVE    Leukocytes, UA NEGATIVE  NEGATIVE   URINE MICROSCOPIC-ADD ON     Status: Abnormal   Collection Time   04/07/12 10:04 PM      Component Value Range Comment   WBC, UA 11-20  <3 WBC/hpf    RBC / HPF 3-6  <3 RBC/hpf    Bacteria, UA MANY (*)  RARE   CBC WITH DIFFERENTIAL     Status: Abnormal   Collection Time   04/07/12 10:05 PM      Component Value Range Comment   WBC 13.7 (*) 4.0 - 10.5 K/uL    RBC 4.37  3.87 - 5.11 MIL/uL    Hemoglobin 12.9  12.0 - 15.0 g/dL    HCT 30.8  65.7 - 84.6 %    MCV 91.5  78.0 - 100.0 fL    MCH 29.5  26.0 - 34.0 pg    MCHC 32.3  30.0 - 36.0 g/dL    RDW 96.2 (*) 95.2 - 15.5 %    Platelets 266  150 - 400 K/uL    Neutrophils Relative 85 (*) 43 - 77 %    Neutro Abs 11.6 (*) 1.7 - 7.7 K/uL    Lymphocytes Relative 6 (*) 12 - 46 %    Lymphs Abs 0.9  0.7 - 4.0 K/uL    Monocytes Relative 8  3 - 12 %    Monocytes Absolute 1.0  0.1 - 1.0 K/uL    Eosinophils Relative 1  0 - 5 %    Eosinophils Absolute 0.2  0.0 - 0.7 K/uL    Basophils Relative 0  0 - 1 %    Basophils Absolute 0.0  0.0 - 0.1 K/uL   COMPREHENSIVE METABOLIC PANEL     Status: Abnormal   Collection Time   04/07/12 10:05 PM      Component Value Range Comment   Sodium 135  135 - 145 mEq/L    Potassium 2.7 (*) 3.5 - 5.1 mEq/L    Chloride 98  96 - 112 mEq/L    CO2 23   19 - 32 mEq/L    Glucose, Bld 132 (*) 70 - 99 mg/dL    BUN 17  6 - 23 mg/dL    Creatinine, Ser 8.41  0.50 - 1.10 mg/dL    Calcium 9.8  8.4 - 32.4 mg/dL    Total Protein 7.8  6.0 - 8.3 g/dL    Albumin 3.4 (*) 3.5 - 5.2 g/dL    AST 20  0 - 37 U/L    ALT 9  0 - 35 U/L    Alkaline Phosphatase 135 (*) 39 - 117 U/L    Total Bilirubin 0.3  0.3 - 1.2 mg/dL    GFR calc non Af Amer 51 (*) >90 mL/min    GFR calc Af Amer 59 (*) >90 mL/min   TROPONIN I     Status: Normal   Collection Time   04/07/12 10:05 PM      Component Value Range Comment   Troponin I <0.30  <0.30 ng/mL   PROCALCITONIN     Status: Normal   Collection Time   04/07/12 10:05 PM      Component Value Range Comment   Procalcitonin <0.10     LACTIC ACID, PLASMA     Status: Abnormal   Collection Time   04/07/12 10:05 PM      Component Value Range Comment   Lactic Acid, Venous 2.7 (*) 0.5 - 2.2 mmol/L     Dg Clavicle Right  04/08/2012  *RADIOLOGY REPORT*  Clinical Data: Fall, pain.  RIGHT CLAVICLE - 2+ VIEWS  Comparison: None.  Findings: Positioning is nonstandard.  No acute bony or joint abnormality is identified.  IMPRESSION: Limited study demonstrating no acute abnormality.   Original Report Authenticated By: Bernadene Bell. D'ALESSIO, M.D.    Dg Shoulder Right  04/08/2012  *RADIOLOGY REPORT*  Clinical Data: Fall.  Right shoulder pain.  RIGHT SHOULDER - 2+ VIEW  Comparison: Plain films of the chest 01/03/2011.  Findings: Positioning is nonstandard.  No fracture or dislocation is identified.  The acromioclavicular joint appears intact. Mid thoracic spine compression fracture is identified and is seen on the prior plain films of the chest.  IMPRESSION: Limited study demonstrating no acute abnormality.   Original Report Authenticated By: Bernadene Bell. Maricela Curet, M.D.    Ct Head Wo Contrast  04/08/2012  *RADIOLOGY REPORT*  Clinical Data: Fall.  Confusion.  CT HEAD WITHOUT CONTRAST  Technique:  Contiguous axial images were obtained from the base  of the skull through the vertex without contrast.  Comparison: Brain MRI 12/20/2005 and head CT scan 11/29/2005.  Findings: Atrophy, chronic microvascular ischemic change and remote left PCA territory infarct are again seen.  No evidence of acute abnormality including infarction, hemorrhage, mass lesion, mass effect, midline shift or abnormal extra-axial fluid collection. Chronic left maxillary sinus disease is noted.  Small amount of fluid is present in the mastoid air cells.  IMPRESSION: No acute finding.   Original Report Authenticated By: Bernadene Bell. D'ALESSIO, M.D.    Dg Chest Portable 1 View  04/07/2012  *RADIOLOGY REPORT*  Clinical Data: Status post fall.  PORTABLE CHEST - 1 VIEW  Comparison: Plain films of the chest 01/03/2011 and 12/06/2011.  Findings: The lungs are clear.  No pneumothorax or pleural fluid. Heart size normal.  No focal bony abnormality.  IMPRESSION: No acute disease.   Original Report Authenticated By: Bernadene Bell. Maricela Curet, M.D.     Review of Systems  Unable to perform ROS: dementia   Blood pressure 181/87, pulse 85, temperature 97.1 F (36.2 C), temperature source Axillary, resp. rate 19, height 5\' 5"  (1.651 m), weight 40.1 kg (88 lb 6.5 oz), SpO2 100.00%. Physical Exam  Assessment/Plan: New onset sz Also see dictation and orders.   Andrey Mccaskill 04/08/2012, 9:11 AM

## 2012-04-09 ENCOUNTER — Inpatient Hospital Stay (HOSPITAL_COMMUNITY): Payer: Medicare Other

## 2012-04-09 LAB — URINE CULTURE: Colony Count: >=100000

## 2012-04-09 LAB — BASIC METABOLIC PANEL
BUN: 13 mg/dL (ref 6–23)
Calcium: 9.3 mg/dL (ref 8.4–10.5)
Creatinine, Ser: 1.02 mg/dL (ref 0.50–1.10)
GFR calc non Af Amer: 54 mL/min — ABNORMAL LOW (ref >90)
Glucose, Bld: 92 mg/dL (ref 70–99)

## 2012-04-09 LAB — CBC
Hemoglobin: 12 g/dL (ref 12.0–15.0)
MCH: 29.4 pg (ref 26.0–34.0)
MCHC: 32.1 g/dL (ref 30.0–36.0)
Platelets: 241 10*3/uL (ref 150–400)
RDW: 16.1 % — ABNORMAL HIGH (ref 11.5–15.5)

## 2012-04-09 MED ORDER — LEVOTHYROXINE SODIUM 100 MCG PO TABS
100.0000 ug | ORAL_TABLET | Freq: Every day | ORAL | Status: DC
  Administered 2012-04-09 – 2012-04-10 (×2): 100 ug via ORAL
  Filled 2012-04-09 (×2): qty 1

## 2012-04-09 MED ORDER — POLYVINYL ALCOHOL 1.4 % OP SOLN: 1.0000 [drp] | OPHTHALMIC | Status: DC | PRN

## 2012-04-09 MED ORDER — LEVETIRACETAM 500 MG PO TABS
250.0000 mg | ORAL_TABLET | Freq: Two times a day (BID) | ORAL | Status: DC
  Administered 2012-04-09 – 2012-04-10 (×2): 250 mg via ORAL
  Filled 2012-04-09 (×2): qty 2

## 2012-04-09 NOTE — Procedures (Signed)
[facility] Edin Kon A. Gerilyn Pilgrim, MD     [email or web address]        NAME:  [name], [name]                  ACCOUNT NO.:  [id number]  MEDICAL RECORD NO.:  [id number]  LOCATION:  IC10                          FACILITY:  [facility]  PHYSICIAN:  Tyrah Broers A. Gerilyn Pilgrim, M.D. DATE OF BIRTH:  1941/02/09  DATE OF PROCEDURE: DATE OF DISCHARGE:                             EEG INTERPRETATION   INDICATION:  A 71 year old who presents with new-onset seizure.  MEDICATIONS:  Rocephin, Keppra, Ativan, potassium, sertraline, simvastatin, and irbesartan.  ANALYSIS:  A 16-channel recording is conducted for 21 minutes using standard 10/20 measurements.  There is a well-formed posterior dominant rhythm of 12 Hz with attenuates with eye opening.  Awake and sleep activities are recorded as indicated by K complexes and sleep spindles. Photic stimulation and hypoventilation were not done.  There is no focal or lateralized slowing.  There is no epileptiform activity observed.  IMPRESSION:  Normal recording of awake and sleep states.     Julio Zappia A. Gerilyn Pilgrim, M.D.     KAD/MEDQ  D:  04/09/2012  T:  04/09/2012  Job:  [id number]

## 2012-04-09 NOTE — Progress Notes (Signed)
Subjective: Interval History:   Objective: Vital signs in last 24 hours: Temp:  [97.7 F (36.5 C)-99.1 F (37.3 C)] 98.6 F (37 C) (09/17 0735) Pulse Rate:  [65-87] 80  (09/17 0800) Resp:  [16-28] 19  (09/17 0800) BP: (93-165)/(59-105) 154/89 mmHg (09/17 0800) SpO2:  [94 %-99 %] 97 % (09/17 0800) Weight:  [40.7 kg (89 lb 11.6 oz)] 40.7 kg (89 lb 11.6 oz) (09/17 0438)  Intake/Output from previous day: 09/16 0701 - 09/17 0700 In: 1605 [P.O.:600; I.V.:750; IV Piggyback:255] Out: 5 [Urine:5] Intake/Output this shift: Total I/O In: 110 [P.O.:110] Out: -  Nutritional status: Dysphagia    Lab Results:  Basename 04/09/12 0439 04/07/12 2205  WBC 6.6 13.7*  HGB 12.0 12.9  HCT 37.4 40.0  PLT 241 266  NA 135 135  K 4.0 2.7*  CL 102 98  CO2 26 23  GLUCOSE 92 132*  BUN 13 17  CREATININE 1.02 1.07  CALCIUM 9.3 9.8  LABA1C -- --   Lipid Panel No results found for this basename: CHOL,TRIG,HDL,CHOLHDL,VLDL,LDLCALC in the last 72 hours  Studies/Results: Dg Clavicle Right  04/08/2012  *RADIOLOGY REPORT*  Clinical Data: Fall, pain.  RIGHT CLAVICLE - 2+ VIEWS  Comparison: None.  Findings: Positioning is nonstandard.  No acute bony or joint abnormality is identified.  IMPRESSION: Limited study demonstrating no acute abnormality.   Original Report Authenticated By: Bernadene Bell. Maricela Curet, M.D.    Dg Shoulder Right  04/08/2012  *RADIOLOGY REPORT*  Clinical Data: Fall.  Right shoulder pain.  RIGHT SHOULDER - 2+ VIEW  Comparison: Plain films of the chest 01/03/2011.  Findings: Positioning is nonstandard.  No fracture or dislocation is identified.  The acromioclavicular joint appears intact. Mid thoracic spine compression fracture is identified and is seen on the prior plain films of the chest.  IMPRESSION: Limited study demonstrating no acute abnormality.   Original Report Authenticated By: Bernadene Bell. Maricela Curet, M.D.    Ct Head Wo Contrast  04/08/2012  *RADIOLOGY REPORT*  Clinical Data: Fall.   Confusion.  CT HEAD WITHOUT CONTRAST  Technique:  Contiguous axial images were obtained from the base of the skull through the vertex without contrast.  Comparison: Brain MRI 12/20/2005 and head CT scan 11/29/2005.  Findings: Atrophy, chronic microvascular ischemic change and remote left PCA territory infarct are again seen.  No evidence of acute abnormality including infarction, hemorrhage, mass lesion, mass effect, midline shift or abnormal extra-axial fluid collection. Chronic left maxillary sinus disease is noted.  Small amount of fluid is present in the mastoid air cells.  IMPRESSION: No acute finding.   Original Report Authenticated By: Bernadene Bell. Maricela Curet, M.D.    Mr Laqueta Jean Wo Contrast  04/08/2012  *RADIOLOGY REPORT*  Clinical Data: 71 year old female with recent fall.  Dementia, seizure.  MRI HEAD WITHOUT AND WITH CONTRAST  Technique:  Multiplanar, multiecho pulse sequences of the brain and surrounding structures were obtained according to standard protocol without and with intravenous contrast  Contrast: 7mL MULTIHANCE GADOBENATE DIMEGLUMINE 529 MG/ML IV SOLN  Comparison:  head CT 04/08/2012.  Brain MRI 12/20/2005.  Findings: Stable cerebral volume since 2007.  Evidence of a punctate area of restricted diffusion in the medial right parietal lobe on series 4 image 14.  No mass effect or hemorrhage.  Major intracranial vascular flow voids are stable.  Elsewhere diffusion is within normal limits.  Extensive chronic ischemic changes in the brain.  Chronic left PCA infarct with left lateral ventricle ex vacuo changes.  Numerous chronic lacunar infarcts in  the deep gray matter nuclei and bilateral corona radiata out a.  Chronic signal changes in the brainstem.  The cervicomedullary junction, visualized cervical spine, and cerebellum are within normal limits.  Negative pituitary.  No abnormal enhancement identified.  Normal bone marrow signal. Visualized orbit soft tissues are within normal limits.  Chronic  mastoid effusions.  Chronic paranasal sinus disease with improved pneumatization.  Chronic mild layering secretions in the nasopharynx.  Negative scalp soft tissues.  IMPRESSION: 1.  Possible punctate acute infarct in the right parietal lobe with no mass effect or hemorrhage. 2.  Otherwise stable advanced chronic ischemic changes to the brain.   Original Report Authenticated By: Harley Hallmark, M.D.    Dg Chest Portable 1 View  04/07/2012  *RADIOLOGY REPORT*  Clinical Data: Status post fall.  PORTABLE CHEST - 1 VIEW  Comparison: Plain films of the chest 01/03/2011 and 12/06/2011.  Findings: The lungs are clear.  No pneumothorax or pleural fluid. Heart size normal.  No focal bony abnormality.  IMPRESSION: No acute disease.   Original Report Authenticated By: Bernadene Bell. Maricela Curet, M.D.     Medications:  Scheduled Meds:   . antiseptic oral rinse  15 mL Mouth Rinse BID  . cefTRIAXone (ROCEPHIN)  IV  1 g Intravenous Q24H  . irbesartan  75 mg Oral Daily  . levetiracetam  250 mg Intravenous Q12H  . levothyroxine  100 mcg Oral QAC breakfast  . sertraline  50 mg Oral QHS  . simvastatin  40 mg Oral QPM  . DISCONTD: levothyroxine  75 mcg Oral QAC breakfast   Continuous Infusions:   . 0.9 % NaCl with KCl 20 mEq / L 75 mL/hr at 04/08/12 1700   PRN Meds:.gadobenate dimeglumine   Assessment/Plan: Also see dictation.        LOS: 2 days   Diamante Rubin

## 2012-04-09 NOTE — Care Management Note (Signed)
    Page 1 of 1   04/10/2012     10:36:49 AM   CARE MANAGEMENT NOTE 04/10/2012  Patient:  [name], [name]   Account Number:  [id number]  Date Initiated:  04/09/2012  Documentation initiated by:  Sharrie Rothman  Subjective/Objective Assessment:   Pt admitted from home with UTI and seizures. Pt lives with son who provides 24 hour care. When son is working, pts nephew and his wife provide care for pt. Pt has walker and hospital bed in the home. Pts son is interested in SNF.     Action/Plan:   CSW is aware of SNF and has started bed search. Will follow if d/c plans change and pt d/c home.   Anticipated DC Date:  04/10/2012   Anticipated DC Plan:  SKILLED NURSING FACILITY  In-house referral  Clinical Social Worker      DC Planning Services  CM consult      Choice offered to / List presented to:             Status of service:  Completed, signed off Medicare Important Message given?  YES (If response is "NO", the following Medicare IM given date fields will be blank) Date Medicare IM given:  04/10/2012 Date Additional Medicare IM given:    Discharge Disposition:  SKILLED NURSING FACILITY  Per UR Regulation:    If discussed at Long Length of Stay Meetings, dates discussed:    Comments:  04/10/12 1035 Arlyss Queen, RN BSN CM Pt discharged to [facility] today. CSW will arrange discharge to facility.  04/09/12 1503 Arlyss Queen, RN BSN CM

## 2012-04-09 NOTE — Progress Notes (Signed)
The patient is receiving Keppra by the intravenous route.  Based on criteria approved by the Pharmacy and Therapeutics Committee and the Medical Executive Committee, the medication is being converted to the equivalent oral dose form.  These criteria include: -No Active GI bleeding -Able to tolerate diet of full liquids (or better) or tube feeding OR able to tolerate other medications by the oral or enteral route  If you have any questions about this conversion, please contact the Pharmacy Department (ext [phone]).  Thank you.  Elson Clan, [facility] 04/09/2012 1:28 PM

## 2012-04-09 NOTE — Clinical Social Work Placement (Signed)
    Clinical Social Work Department CLINICAL SOCIAL WORK PLACEMENT NOTE 04/10/2012  Patient:  [name], [name]  Account Number:  [id number] Admit date:  04/07/2012  Clinical Social Worker:  Santa Genera, CLINICAL SOCIAL WORKER  Date/time:  04/09/2012 10:00 AM  Clinical Social Work is seeking post-discharge placement for this patient at the following level of care:   SKILLED NURSING   (*CSW will update this form in Epic as items are completed)   04/09/2012  Patient/family provided with [facility] Department of Clinical Social Work's list of facilities offering this level of care within the geographic area requested by the patient (or if unable, by the patient's family).  04/09/2012  Patient/family informed of their freedom to choose among providers that offer the needed level of care, that participate in Medicare, Medicaid or managed care program needed by the patient, have an available bed and are willing to accept the patient.  04/09/2012  Patient/family informed of [facility]' ownership interest in [facility], as well as of the fact that they are under no obligation to receive care at this facility.  PASARR submitted to EDS on 04/09/2012 PASARR number received from EDS on 04/09/2012  FL2 transmitted to all facilities in geographic area requested by pt/family on  04/09/2012 FL2 transmitted to all facilities within larger geographic area on   Patient informed that his/her managed care company has contracts with or will negotiate with  certain facilities, including the following:   NA     Patient/family informed of bed offers received:  04/10/2012 Patient chooses bed at [facility] [place] Physician recommends and patient chooses bed at  [facility] [place]  Patient to be transferred to [facility] [place] on  04/10/2012 Patient to be transferred to facility by   The following physician request were entered in Epic:   Additional Comments: Son requests  placement in [place] due to transportation issues.  Santa Genera, LCSW Clinical Social Worker [phone])

## 2012-04-09 NOTE — Progress Notes (Signed)
Subjective: This patient had a comfortable night. She has had no more seizures. She did have a low serum potassium. This has been repleted with KCl by mouth. She was seen in consultation by neurology and MRI of brain was ordered MRI showed possible acute infarct in right parietal lobe and advanced ischemic changes in brain. She does have ongoing urinary tract infection which is being treated with antibiotics Rocephin.   Objectiv  Vital signs in last 24 hours: Temp:  [97.7 F (36.5 C)-99.1 F (37.3 C)] 97.7 F (36.5 C) (09/17 0438) Pulse Rate:  [65-89] 81  (09/17 0400) Resp:  [16-28] 16  (09/17 0400) BP: (93-181)/(59-105) 142/78 mmHg (09/17 0400) SpO2:  [94 %-100 %] 95 % (09/17 0400) Weight:  [40.7 kg (89 lb 11.6 oz)] 40.7 kg (89 lb 11.6 oz) (09/17 0438) Weight change: -0.124 kg (-4.4 oz) Last BM Date: 04/08/12  Intake/Output from previous day: 09/16 0701 - 09/17 0700 In: 1605 [P.O.:600; I.V.:750; IV Piggyback:255] Out: 5 [Urine:5] Intake/Output this shift: Total I/O In: 152.5 [IV Piggyback:152.5] Out: 2 [Urine:2]  Physical Exam: Blood pressure 142/78 pulse 81 temp 99.1 H. EENT negative eyes PERRLA TM negative neck supple no JVD or thyroid abnormality lungs clear to P&A heart regular rhythm the patient is alert and moving all extremities well no focal neurological changes    Basename 04/09/12 0439 04/07/12 2205  WBC 6.6 13.7*  HGB 12.0 12.9  HCT 37.4 40.0  PLT 241 266   BMET  Basename 04/09/12 0439 04/07/12 2205  NA 135 135  K 4.0 2.7*  CL 102 98  CO2 26 23  GLUCOSE 92 132*  BUN 13 17  CREATININE 1.02 1.07  CALCIUM 9.3 9.8    Studies/Results: Dg Clavicle Right  04/08/2012  *RADIOLOGY REPORT*  Clinical Data: Fall, pain.  RIGHT CLAVICLE - 2+ VIEWS  Comparison: None.  Findings: Positioning is nonstandard.  No acute bony or joint abnormality is identified.  IMPRESSION: Limited study demonstrating no acute abnormality.   Original Report Authenticated By: Bernadene Bell.  Maricela Curet, M.D.    Dg Shoulder Right  04/08/2012  *RADIOLOGY REPORT*  Clinical Data: Fall.  Right shoulder pain.  RIGHT SHOULDER - 2+ VIEW  Comparison: Plain films of the chest 01/03/2011.  Findings: Positioning is nonstandard.  No fracture or dislocation is identified.  The acromioclavicular joint appears intact. Mid thoracic spine compression fracture is identified and is seen on the prior plain films of the chest.  IMPRESSION: Limited study demonstrating no acute abnormality.   Original Report Authenticated By: Bernadene Bell. Maricela Curet, M.D.    Ct Head Wo Contrast  04/08/2012  *RADIOLOGY REPORT*  Clinical Data: Fall.  Confusion.  CT HEAD WITHOUT CONTRAST  Technique:  Contiguous axial images were obtained from the base of the skull through the vertex without contrast.  Comparison: Brain MRI 12/20/2005 and head CT scan 11/29/2005.  Findings: Atrophy, chronic microvascular ischemic change and remote left PCA territory infarct are again seen.  No evidence of acute abnormality including infarction, hemorrhage, mass lesion, mass effect, midline shift or abnormal extra-axial fluid collection. Chronic left maxillary sinus disease is noted.  Small amount of fluid is present in the mastoid air cells.  IMPRESSION: No acute finding.   Original Report Authenticated By: Bernadene Bell. Maricela Curet, M.D.    Mr Laqueta Jean Wo Contrast  04/08/2012  *RADIOLOGY REPORT*  Clinical Data: 71 year old female with recent fall.  Dementia, seizure.  MRI HEAD WITHOUT AND WITH CONTRAST  Technique:  Multiplanar, multiecho pulse sequences of the brain  and surrounding structures were obtained according to standard protocol without and with intravenous contrast  Contrast: 7mL MULTIHANCE GADOBENATE DIMEGLUMINE 529 MG/ML IV SOLN  Comparison:  head CT 04/08/2012.  Brain MRI 12/20/2005.  Findings: Stable cerebral volume since 2007.  Evidence of a punctate area of restricted diffusion in the medial right parietal lobe on series 4 image 14.  No mass effect or  hemorrhage.  Major intracranial vascular flow voids are stable.  Elsewhere diffusion is within normal limits.  Extensive chronic ischemic changes in the brain.  Chronic left PCA infarct with left lateral ventricle ex vacuo changes.  Numerous chronic lacunar infarcts in the deep gray matter nuclei and bilateral corona radiata out a.  Chronic signal changes in the brainstem.  The cervicomedullary junction, visualized cervical spine, and cerebellum are within normal limits.  Negative pituitary.  No abnormal enhancement identified.  Normal bone marrow signal. Visualized orbit soft tissues are within normal limits.  Chronic mastoid effusions.  Chronic paranasal sinus disease with improved pneumatization.  Chronic mild layering secretions in the nasopharynx.  Negative scalp soft tissues.  IMPRESSION: 1.  Possible punctate acute infarct in the right parietal lobe with no mass effect or hemorrhage. 2.  Otherwise stable advanced chronic ischemic changes to the brain.   Original Report Authenticated By: Harley Hallmark, M.D.    Dg Chest Portable 1 View  04/07/2012  *RADIOLOGY REPORT*  Clinical Data: Status post fall.  PORTABLE CHEST - 1 VIEW  Comparison: Plain films of the chest 01/03/2011 and 12/06/2011.  Findings: The lungs are clear.  No pneumothorax or pleural fluid. Heart size normal.  No focal bony abnormality.  IMPRESSION: No acute disease.   Original Report Authenticated By: Bernadene Bell. Maricela Curet, M.D.     Medications:     . antiseptic oral rinse  15 mL Mouth Rinse BID  . cefTRIAXone (ROCEPHIN)  IV  1 g Intravenous Q24H  . irbesartan  75 mg Oral Daily  . levetiracetam  250 mg Intravenous Q12H  . levothyroxine  75 mcg Oral QAC breakfast  . sertraline  50 mg Oral QHS  . simvastatin  40 mg Oral QPM  . DISCONTD: levetiracetam  1,000 mg Intravenous Q24H  . DISCONTD: levetiracetam  500 mg Intravenous Q12H       . 0.9 % NaCl with KCl 20 mEq / L 75 mL/hr at 04/08/12 1700     Assessment/Plan: 1 urinary  tract infection plan to continue current IV antibiotics Rocephin 2 the patient has a possible acute infarct in right parietal lobe. She is in the process of the continued evaluation by neurology and will in all likelihood need physical therapy. 3 she had a recent low serum potassium. This has been repleted but will be reassessed.  LOS: 2 days  4 pati. ent does have hypothyroidism Synthroid dosage needs to be adjusted  Braley Luckenbaugh G 04/09/2012, 6:14 AM

## 2012-04-09 NOTE — Evaluation (Signed)
Physical Therapy Evaluation Patient Details Name: [name] MRN: [id number] DOB: 12/18/1940 Today's Date: 04/09/2012 Time: 0827-0900 PT Time Calculation (min): 33 min  PT Assessment / Plan / Recommendation Clinical Impression  Pt was seen for eval.  She is known to this therapist from previous admissions and was found to be alert, cooperative and able to follow directions.  She functions at a very low level at home and only walks for a few min. each day with a walker.  However, according to her son, she is able to ambulate independently when she does walk.  Currently, she requires max assist to ambulate 77' with an extremely unstable gait pattern..  She has an increased amount of tone on the L side of her body which is greatly inhibiting gait.  Because of this, I would recommend SNF at d/c in order to give her a chance to develop improved gait proficiency.  if family takes her home, they will need a w/c and HHPT.    PT Assessment  Patient needs continued PT services    Follow Up Recommendations  Skilled nursing facility    Barriers to Discharge None      Equipment Recommendations  Defer to next venue    Recommendations for Other Services     Frequency Min 3X/week    Precautions / Restrictions Precautions Precautions: Fall Restrictions Weight Bearing Restrictions: No   Pertinent Vitals/Pain       Mobility  Bed Mobility Bed Mobility: Supine to Sit;Sit to Supine;Sitting - Scoot to [place] of Bed;Scooting to HOB Supine to Sit: 4: Min guard;HOB elevated Sitting - Scoot to [place] of Bed: 4: Min assist Sit to Supine: 4: Min guard;HOB flat Scooting to HOB: 4: Min assist Transfers Transfers: Sit to Stand;Stand to Sit Sit to Stand: 4: Min assist;With upper extremity assist;From bed Stand to Sit: 2: Max assist;With upper extremity assist;To bed Details for Transfer Assistance: pt was very anxious to sit and tried to sit prematurely,,,she was unable to coordinate her LEs well enough in  order to position herself safely for sitting...therapist had to almost fully carry her body weight to the bed Ambulation/Gait Ambulation/Gait Assistance: 2: Max assist Ambulation Distance (Feet): 12 Feet Assistive device: Rolling walker Ambulation/Gait Assistance Details: gait is extremely unstable and pt is unable to control LLE enough to keep it centered as a  BOS...she maintains it fully abducted due to extreme  trunk rotation...she also pushes walker so far forward that it is unable to assist her gait...a standard walker may be a better option Gait Pattern: Step-to pattern;Decreased step length - left;Decreased stance time - left;Decreased hip/knee flexion - left;Decreased weight shift to left;Lateral trunk lean to right;Trunk rotated posteriorly on left;Trunk flexed Gait velocity: ver slow and unstable Stairs: No Wheelchair Mobility Wheelchair Mobility: No    Exercises     PT Diagnosis: Difficulty walking;Abnormality of gait;Generalized weakness  PT Problem List: Decreased strength;Decreased activity tolerance;Decreased mobility;Decreased coordination;Impaired tone PT Treatment Interventions: Gait training;Functional mobility training;Therapeutic activities;Neuromuscular re-education   PT Goals Acute Rehab PT Goals PT Goal Formulation: With patient Time For Goal Achievement: 04/23/12 Potential to Achieve Goals: Fair Pt will go Sit to Stand: with supervision;with upper extremity assist PT Goal: Sit to Stand - Progress: Goal set today Pt will go Stand to Sit: with mod assist;with upper extremity assist PT Goal: Stand to Sit - Progress: Goal set today Pt will Stand: with supervision;with bilateral upper extremity support;1 - 2 min (and able to complete coordination activities) PT Goal: Stand -  Progress: Goal set today Pt will Ambulate: 16 - 50 feet;with mod assist;with rolling walker;with standard walker (evaluate effectiveness of standard walker) PT Goal: Ambulate - Progress: Goal  set today  Visit Information  Last PT Received On: 04/09/12    Subjective Data  Subjective: no c/o Patient Stated Goal: none stated   Prior Functioning  Home Living Lives With: Son Available Help at Discharge: Family Type of Home: House Home Access: Stairs to enter [employer] of Steps: 2.Marland KitchenMarland KitchenPt's son has to assist her with stairs Entrance Stairs-Rails: None Home Layout: One level Bathroom Toilet: Standard Bathroom Accessibility:  (pt is incontinent, total care with bathing) Home Adaptive Equipment: Walker - rolling Prior Function Level of Independence: Needs assistance Needs Assistance: Bathing;Dressing;Grooming;Toileting;Meal Prep;Light Housekeeping Bath: Total Dressing: Total Grooming: Total Toileting: Total Meal Prep: Total Light Housekeeping: Total Able to Take Stairs?: Yes (with assist) Driving: No Vocation: Retired Musician: Expressive difficulties    Cognition  Overall Cognitive Status: History of cognitive impairments - at baseline Arousal/Alertness: Awake/alert Orientation Level: Appears intact for tasks assessed Behavior During Session: [facility] for tasks performed Cognition - Other Comments: cooperative and able to follow directions    Extremity/Trunk Assessment Right Upper Extremity Assessment RUE ROM/Strength/Tone: Deficits;WFL for tasks assessed RUE ROM/Strength/Tone Deficits: postures UE in flexion when at rest but arm is functional...she does c/o shoulder soreness when lifting her arm (due to recent seizure) RUE Sensation: WFL - Light Touch;WFL - Proprioception Left Upper Extremity Assessment LUE ROM/Strength/Tone: Deficits LUE ROM/Strength/Tone Deficits: there is some flexor tone in UE and pt postures the arm in flexion.  She has contracture of 4th and 5th digits in full flexion.  She does not appear to have much function in UE Right Lower Extremity Assessment RLE ROM/Strength/Tone: [facility] for tasks assessed;Deficits RLE  ROM/Strength/Tone Deficits: she appears to have mild increased tone in leg, but no contracture RLE Sensation: WFL - Light Touch;WFL - Proprioception RLE Coordination: WFL - gross motor Left Lower Extremity Assessment LLE ROM/Strength/Tone: Deficits LLE ROM/Strength/Tone Deficits: able to move leg against gravity, but milld to mod tone in leg (and trunk) in extension...this is especially evident during gait LLE Sensation: WFL - Light Touch   Balance Balance Balance Assessed: Yes Static Sitting Balance Static Sitting - Balance Support: No upper extremity supported Static Sitting - Level of Assistance: 6: Modified independent (Device/Increase time) Dynamic Sitting Balance Dynamic Sitting - Balance Support: No upper extremity supported Dynamic Sitting - Level of Assistance: 5: Stand by assistance Static Standing Balance Static Standing - Balance Support: Bilateral upper extremity supported Static Standing - Level of Assistance: 4: Min assist  End of Session PT - End of Session Equipment Utilized During Treatment: Gait belt Activity Tolerance: Patient tolerated treatment well Patient left: in bed;with call bell/phone within reach;with bed alarm set Nurse Communication: Mobility status  GP     Konrad Penta 04/09/2012, 9:32 AM

## 2012-04-09 NOTE — Clinical Social Work Note (Signed)
CSW met w patient and son at bedside, discussed PT recommendation of SNF placement for short term rehab.  Son agreeable to seeking bed offers, but asked that only facilities in [place] be considered as he does not have transportation to visit mother if she is placed out of town.  CSW prepared FL2 and faxed patient information out for bed offers.    Santa Genera, LCSW Clinical Social Worker [phone])

## 2012-04-09 NOTE — Progress Notes (Signed)
[facility] [name] Zaccaro A. Gerilyn Pilgrim, MD     [email or web address]        SUBJECTIVE: No recurrent seizure reported. She still drowsy but improved.  OBJECTIVE: She is sleeping with eyes closed. She does openes her eyes to light sternal rub. She falls commands rather brisk on the right side. Again, she appears to have left upper extremity weakness with a wrist drop associated with increased tone.  Labs: TSH markedly elevated 96.1, vitamin B12 400, homocysteine 21 and RPR nonreactive. ASSESSMENT/PLAN:  1. 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 The patient is to continue on low-dose Keppra 250 twice a day. 2. Encephalopathy due to urinary tract infection, medication effect and the possibility being postictal. 3. Dementia at baseline. The dementia is at least partly due to a treatable condition hypothyroidism. Her PCP has made adjustments in the Synthroid. This will need to be followed carefully and closely. Given the elevated homocysteine level, folic acid will also be added. 4. I will sign off reconsult as needed. Beryle Beams, MD Diplomate, American Board of Psychiatry and Neurology (Neurology).

## 2012-04-09 NOTE — Progress Notes (Signed)
Addendum:  MRI shows possible punctate right parietal infarct. Aspirin and the carotids are suggested. This is discussed with Dr. Renard Matter.

## 2012-04-10 MED ORDER — LEVOTHYROXINE SODIUM 100 MCG PO TABS: 100.0000 ug | ORAL_TABLET | Freq: Every day | ORAL | Status: DC

## 2012-04-10 MED ORDER — LEVETIRACETAM 250 MG PO TABS: 250.0000 mg | ORAL_TABLET | Freq: Two times a day (BID) | ORAL | Status: DC

## 2012-04-10 NOTE — Progress Notes (Signed)
Subjective: Interval History:   Objective: Vital signs in last 24 hours: Temp:  [97.4 F (36.3 C)-99.1 F (37.3 C)] 97.4 F (36.3 C) (09/18 0453) Pulse Rate:  [70-80] 70  (09/18 0453) Resp:  [18-20] 18  (09/18 0453) BP: (105-154)/(67-89) 121/77 mmHg (09/18 0453) SpO2:  [95 %-97 %] 95 % (09/18 0453)  Intake/Output from previous day: 09/17 0701 - 09/18 0700 In: 470 [P.O.:470] Out: -  Intake/Output this shift:   Nutritional status: Dysphagia    Lab Results:  [facility] 04/09/12 0439 04/07/12 2205  WBC 6.6 13.7*  HGB 12.0 12.9  HCT 37.4 40.0  PLT 241 266  NA 135 135  K 4.0 2.7*  CL 102 98  CO2 26 23  GLUCOSE 92 132*  BUN 13 17  CREATININE 1.02 1.07  CALCIUM 9.3 9.8  LABA1C -- --   Lipid Panel No results found for this basename: CHOL,TRIG,HDL,CHOLHDL,VLDL,LDLCALC in the last 72 hours  Studies/Results: Mr [name] Wo Contrast  04/08/2012  *RADIOLOGY REPORT*  Clinical Data: 71 year old female with recent fall.  Dementia, seizure.  MRI HEAD WITHOUT AND WITH CONTRAST  Technique:  Multiplanar, multiecho pulse sequences of the brain and surrounding structures were obtained according to standard protocol without and with intravenous contrast  Contrast: 7mL MULTIHANCE GADOBENATE DIMEGLUMINE 529 MG/ML IV SOLN  Comparison:  head CT 04/08/2012.  Brain MRI 12/20/2005.  Findings: Stable cerebral volume since 2007.  Evidence of a punctate area of restricted diffusion in the medial right parietal lobe on series 4 image 14.  No mass effect or hemorrhage.  Major intracranial vascular flow voids are stable.  Elsewhere diffusion is within normal limits.  Extensive chronic ischemic changes in the brain.  Chronic left PCA infarct with left lateral ventricle ex vacuo changes.  Numerous chronic lacunar infarcts in the deep gray matter nuclei and bilateral corona radiata out a.  Chronic signal changes in the brainstem.  The cervicomedullary junction, visualized cervical spine, and cerebellum are within  normal limits.  Negative pituitary.  No abnormal enhancement identified.  Normal bone marrow signal. Visualized orbit soft tissues are within normal limits.  Chronic mastoid effusions.  Chronic paranasal sinus disease with improved pneumatization.  Chronic mild layering secretions in the nasopharynx.  Negative scalp soft tissues.  IMPRESSION: 1.  Possible punctate acute infarct in the right parietal lobe with no mass effect or hemorrhage. 2.  Otherwise stable advanced chronic ischemic changes to the brain.   Original Report Authenticated By: Harley Hallmark, M.D.     Medications:  Scheduled Meds:    . antiseptic oral rinse  15 mL Mouth Rinse BID  . cefTRIAXone (ROCEPHIN)  IV  1 g Intravenous Q24H  . irbesartan  75 mg Oral Daily  . levETIRAcetam  250 mg Oral BID  . levothyroxine  100 mcg Oral QAC breakfast  . sertraline  50 mg Oral QHS  . simvastatin  40 mg Oral QPM  . DISCONTD: levetiracetam  250 mg Intravenous Q12H   Continuous Infusions:  PRN Meds:.polyvinyl alcohol   Assessment/Plan: Also see dictation.        LOS: 3 days   [name]

## 2012-04-10 NOTE — Discharge Summary (Signed)
NAME:  [name], [name]                  ACCOUNT NO.:  [id number]  MEDICAL RECORD NO.:  [id number]  LOCATION:  A302                          FACILITY:  [facility]  PHYSICIAN:  Aidel Davisson G. Dajai Wahlert, MD   DATE OF BIRTH:  1940-11-11  DATE OF ADMISSION:  04/07/2012 DATE OF DISCHARGE:  09/18/2013LH                              DISCHARGE SUMMARY   ADDENDUM:  The patient was discharged on following medications: Synthroid 100 mcg daily, Keppra 250 mg b.i.d., simvastatin 40 mg daily, Aricept 5 mg daily, Benicar 40 mg daily, Zoloft 50 mg daily.  The patient was treated with Keppra during the hospital stay.  This was given to prevent further seizures.     Otto Caraway G. Renard Matter, MD     AGM/MEDQ  D:  04/10/2012  T:  04/10/2012  Job:  [id number]

## 2012-04-10 NOTE — Discharge Summary (Signed)
NAME:  [name], [name]                  ACCOUNT NO.:  [id number]  MEDICAL RECORD NO.:  [id number]  LOCATION:  A302                          FACILITY:  [facility]  PHYSICIAN:  Shatora Weatherbee G. Renard Matter, MD   DATE OF BIRTH:  Oct 28, 1940  DATE OF ADMISSION:  04/07/2012 DATE OF DISCHARGE:  09/18/2013LH                              DISCHARGE SUMMARY   DIAGNOSES: 1. Seizure disorder. 2. 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 3. Urinary tract infection. 4. Dementia. 5. Hypothyroidism.  CONDITION:  Stable and improved at the time of her discharge.  This patient was found lying on the floor.  Apparently, she had fallen. She was taken to the emergency room, and while in the emergency department had a grand mal type seizure which was witnessed.  She apparently has had some short-term memory impairment.  The patient at the time of her admission to emergency department was found to have urinary tract infection, was placed on antibiotics and started on Dilantin.  A CT of the head at that time showed evidence of old stroke.  PHYSICAL EXAMINATION:  On admission. VITAL SIGNS:  Blood pressure 105/67, respirations 18, pulse 69, temp 97.4. HEENT:  Eyes PERRLA.  TMs negative.  Oropharynx benign. NECK:  Supple.  No JVD or thyroid abnormalities. HEART:  Regular rhythm.  No murmurs. LUNGS:  Clear to P and A. ABDOMEN:  No palpable organs or masses.  No organomegaly. EXTREMITIES:  Free of edema. NEUROLOGIC:  Cranial nerves intact.  Extremity show no abnormal weakness.  No sensory disturbance.  Reflexes were equal and normal.  LABORATORY DATA:  CBC on admission WBC 13,700, hemoglobin 12.9, hematocrit 40.0.  Chemistries on admission, sodium 135, potassium 2.7, chloride 98, CO2 23, BUN 17, calcium 9.8.  Glucose 132, alkaline phosphatase 135, albumin 3.4.  AST 20, ALT 9.  Vitamin B12 400, homocystine 21.0.  Subsequent CBC, WBC 6600 with hemoglobin 12.0 and hematocrit 37.4.  Subsequent chemistries,  sodium 135, potassium 4, chloride 102, CO2 26, calcium 9.3.  Glucose 62, alkaline phosphatase 92. Thyroid TSH 96.156.  Urinalysis positive nitrite with 11-20 WBCs.  RADIOLOGY:  Chest x-ray on admission, no acute disease.  Right shoulder, limited study.  No acute abnormality.  CT of the head without contrast, no acute findings.  MRI of the brain without contrast, possible punctate acute infarct in right parietal lobe with no mass effect or hemorrhage. Otherwise, stable advanced chronic ischemic changes of the brain. Carotid Doppler ultrasound, report pending.  HOSPITAL COURSE:  The patient at the time of her admission was started on intravenous fluids.  She was given IV Dilantin in the emergency department during the seizure which she experienced there, was subsequently admitted and continued on the following medications: Synthroid 75 mcg daily p.o., this was increased to 100 mcg daily because of elevated TSH.  She was continued on sertraline 50 mg at bedtime and simvastatin 40 mg at bedtime.  She was also started on Keppra IV 1000 mg daily, this was changed to 250 mg b.i.d.  She was also started on IV Rocephin 1 g every 24 hours because of urinary tract infection.  The patient had no further seizures during the hospital  stay.  She was gradually ambulating and was given dysphagia diet started on September, 16, 2013.  The patient was also seen in consultation by Neurology, Dr. Gerilyn Pilgrim, who did recommend EEG and recommended that she have  Tegretol. It was noted she had generalized atrophy and large area of encephalomalacia in the left occipital area approximating the left PCA. EEG report pending.  It was felt the patient can be discharged home today.     Iktan Aikman G. Renard Matter, MD     AGM/MEDQ  D:  04/10/2012  T:  04/10/2012  Job:  [id number]

## 2012-04-10 NOTE — Clinical Social Work Note (Signed)
CSW spoke w daughter in room, daughter stated that patient's aunt usually transports patient to MD appts and may be available to transport to SNF this afternoon.  Daughter will transport is aunt is unavailable.    Santa Genera, LCSW Clinical Social Worker [phone])

## 2012-04-10 NOTE — Discharge Summary (Signed)
NAME:  [name], [name]                  ACCOUNT NO.:  [id number]  MEDICAL RECORD NO.:  [id number]  LOCATION:  A302                          FACILITY:  [facility]  PHYSICIAN:  Glennette Galster G. Renard Matter, MD   DATE OF BIRTH:  May 31, 1941  DATE OF ADMISSION:  04/07/2012 DATE OF DISCHARGE:  09/18/2013LH                              DISCHARGE SUMMARY   ADDENDUM:  The patient was also sent home on Cipro 500 mg twice daily for urinary tract infection.     [name] G. Renard Matter, MD     AGM/MEDQ  D:  04/10/2012  T:  04/10/2012  Job:  [id number]

## 2012-04-10 NOTE — Progress Notes (Signed)
[facility] [name] A. Gerilyn Pilgrim, MD     [email or web address]        SUBJECTIVE: No seizures are reported.  OBJECTIVE: She is awake and alert which represents a dramatic improvement. She speaks and follow commands well. Speech is moderately dysarthric. 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 She is unable to extend the little finger and adjacent 2 fingers on that side. She has decent strength in the proximal left upper extremity however. The right side is about 4+ at least. Carotid duplex Doppler has been done and results are pending.   ASSESSMENT/PLAN:  1. 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 2. Possible tiny lacunar infarct. She has been placed on aspirin and carotid duplex Doppler is pending. 3. 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 This is being treated.  Beryle Beams, MD Diplomate, American Board of Psychiatry and Neurology (Neurology).

## 2012-04-10 NOTE — Clinical Social Work Note (Signed)
Met w son and patient to give bed offer.  Family and patient agreeable to patient transfer to [facility] for rehab this afternoon, and instructed to meet w admissions at [facility].  FL2 updated and reviewed w RN.  Discharge packet prepared and placed in [facility] for transport this afternoon, RN will call report to [facility].   Santa Genera, LCSW Clinical Social Worker [phone])

## 2012-04-16 DIAGNOSIS — E440 Moderate protein-calorie malnutrition: Secondary | ICD-10-CM

## 2012-05-07 ENCOUNTER — Encounter (HOSPITAL_COMMUNITY): Payer: Self-pay | Admitting: *Deleted

## 2012-05-07 ENCOUNTER — Inpatient Hospital Stay (HOSPITAL_COMMUNITY)
Admission: EM | Admit: 2012-05-07 | Discharge: 2012-05-11 | DRG: 641 | Disposition: A | Discharge status: Home / Self Care | Payer: Medicare Other | Attending: Family Medicine | Admitting: Family Medicine

## 2012-05-07 DIAGNOSIS — N189 Chronic kidney disease, unspecified: Secondary | ICD-10-CM | POA: Diagnosis present

## 2012-05-07 DIAGNOSIS — E440 Moderate protein-calorie malnutrition: Secondary | ICD-10-CM | POA: Diagnosis present

## 2012-05-07 DIAGNOSIS — E875 Hyperkalemia: Principal | ICD-10-CM | POA: Diagnosis present

## 2012-05-07 DIAGNOSIS — G40909 Epilepsy, unspecified, not intractable, without status epilepticus: Secondary | ICD-10-CM | POA: Diagnosis present

## 2012-05-07 DIAGNOSIS — N289 Disorder of kidney and ureter, unspecified: Secondary | ICD-10-CM

## 2012-05-07 DIAGNOSIS — Z681 Body mass index (BMI) 19 or less, adult: Secondary | ICD-10-CM

## 2012-05-07 DIAGNOSIS — F0390 Unspecified dementia without behavioral disturbance: Secondary | ICD-10-CM | POA: Diagnosis present

## 2012-05-07 DIAGNOSIS — E860 Dehydration: Secondary | ICD-10-CM | POA: Diagnosis present

## 2012-05-07 DIAGNOSIS — E039 Hypothyroidism, unspecified: Secondary | ICD-10-CM | POA: Diagnosis present

## 2012-05-07 DIAGNOSIS — N179 Acute kidney failure, unspecified: Secondary | ICD-10-CM | POA: Diagnosis present

## 2012-05-07 LAB — URINALYSIS, ROUTINE W REFLEX MICROSCOPIC
Leukocytes, UA: NEGATIVE
Protein, ur: NEGATIVE mg/dL
Urobilinogen, UA: 0.2 mg/dL (ref 0.0–1.0)

## 2012-05-07 LAB — BASIC METABOLIC PANEL
Calcium: 9.5 mg/dL (ref 8.4–10.5)
GFR calc Af Amer: 20 mL/min — ABNORMAL LOW (ref >90)
GFR calc non Af Amer: 18 mL/min — ABNORMAL LOW (ref >90)
Glucose, Bld: 132 mg/dL — ABNORMAL HIGH (ref 70–99)
Sodium: 131 mEq/L — ABNORMAL LOW (ref 135–145)

## 2012-05-07 LAB — CBC WITH DIFFERENTIAL/PLATELET
Basophils Relative: 0 % (ref 0–1)
Eosinophils Absolute: 0.5 10*3/uL (ref 0.0–0.7)
Lymphs Abs: 1.5 10*3/uL (ref 0.7–4.0)
MCH: 29.5 pg (ref 26.0–34.0)
Neutrophils Relative %: 59 % (ref 43–77)
Platelets: 280 10*3/uL (ref 150–400)
RBC: 3.93 MIL/uL (ref 3.87–5.11)

## 2012-05-07 MED ORDER — SODIUM CHLORIDE 0.9 % IV SOLN
INTRAVENOUS | Status: DC
  Administered 2012-05-07: 21:00:00 via INTRAVENOUS

## 2012-05-07 MED ORDER — SERTRALINE HCL 50 MG PO TABS
50.0000 mg | ORAL_TABLET | Freq: Every day | ORAL | Status: DC
  Administered 2012-05-07 – 2012-05-11 (×5): 50 mg via ORAL
  Filled 2012-05-07 (×5): qty 1

## 2012-05-07 MED ORDER — DEXTROSE 50 % IV SOLN
1.0000 | Freq: Once | INTRAVENOUS | Status: AC
  Administered 2012-05-07: 50 mL via INTRAVENOUS

## 2012-05-07 MED ORDER — DONEPEZIL HCL 5 MG PO TABS
5.0000 mg | ORAL_TABLET | Freq: Every day | ORAL | Status: DC
  Administered 2012-05-07 – 2012-05-10 (×4): 5 mg via ORAL
  Filled 2012-05-07 (×5): qty 1

## 2012-05-07 MED ORDER — SODIUM CHLORIDE 0.9 % IV SOLN
Freq: Once | INTRAVENOUS | Status: AC
  Administered 2012-05-07: 17:00:00 via INTRAVENOUS

## 2012-05-07 MED ORDER — INSULIN ASPART 100 UNIT/ML ~~LOC~~ SOLN
10.0000 [IU] | Freq: Once | SUBCUTANEOUS | Status: AC
  Administered 2012-05-07: 10 [IU] via SUBCUTANEOUS
  Filled 2012-05-07: qty 1

## 2012-05-07 MED ORDER — LEVETIRACETAM 500 MG PO TABS
250.0000 mg | ORAL_TABLET | Freq: Two times a day (BID) | ORAL | Status: DC
  Administered 2012-05-07 – 2012-05-11 (×8): 250 mg via ORAL
  Filled 2012-05-07: qty 2
  Filled 2012-05-07 (×4): qty 1
  Filled 2012-05-07: qty 2
  Filled 2012-05-07 (×2): qty 1

## 2012-05-07 MED ORDER — SIMVASTATIN 20 MG PO TABS
40.0000 mg | ORAL_TABLET | Freq: Every evening | ORAL | Status: DC
  Administered 2012-05-07 – 2012-05-10 (×4): 40 mg via ORAL
  Filled 2012-05-07 (×4): qty 2

## 2012-05-07 MED ORDER — SODIUM POLYSTYRENE SULFONATE 15 GM/60ML PO SUSP
60.0000 g | Freq: Once | ORAL | Status: AC
  Administered 2012-05-07: 60 g via ORAL
  Filled 2012-05-07: qty 240
  Filled 2012-05-07: qty 120

## 2012-05-07 MED ORDER — SODIUM CHLORIDE 0.9 % IV SOLN
1.0000 g | Freq: Once | INTRAVENOUS | Status: AC
  Administered 2012-05-07: 1 g via INTRAVENOUS
  Filled 2012-05-07: qty 10

## 2012-05-07 MED ORDER — DEXTROSE 50 % IV SOLN: 1.0000 | Freq: Once | INTRAVENOUS | Status: DC

## 2012-05-07 MED ORDER — IRBESARTAN 150 MG PO TABS
150.0000 mg | ORAL_TABLET | Freq: Every day | ORAL | Status: DC
Start: 2012-05-07 — End: 2012-05-11
  Administered 2012-05-07 – 2012-05-11 (×5): 150 mg via ORAL
  Filled 2012-05-07 (×5): qty 1

## 2012-05-07 MED ORDER — ENOXAPARIN SODIUM 40 MG/0.4ML ~~LOC~~ SOLN
40.0000 mg | SUBCUTANEOUS | Status: DC
  Administered 2012-05-07: 40 mg via SUBCUTANEOUS
  Filled 2012-05-07: qty 0.4

## 2012-05-07 MED ORDER — SODIUM BICARBONATE 8.4 % IV SOLN
50.0000 meq | Freq: Once | INTRAVENOUS | Status: AC
  Administered 2012-05-07: 50 meq via INTRAVENOUS
  Filled 2012-05-07: qty 50

## 2012-05-07 MED ORDER — LEVOTHYROXINE SODIUM 100 MCG PO TABS
100.0000 ug | ORAL_TABLET | Freq: Every morning | ORAL | Status: DC
  Administered 2012-05-08 – 2012-05-11 (×4): 100 ug via ORAL
  Filled 2012-05-07 (×4): qty 1

## 2012-05-07 NOTE — ED Notes (Signed)
Per staff at [facility] - pt had routine lab draw and potassium came back at 6.8.  Sent her for evaluation.  Denies pain.

## 2012-05-07 NOTE — ED Provider Notes (Signed)
History     CSN: [id number]  Arrival date & time 05/07/12  1500   First MD Initiated Contact with Patient 05/07/12 1545      Chief Complaint  Patient presents with  . hyperkalemia   Level V caveat for dementia  (Consider location/radiation/quality/duration/timing/severity/associated sxs/prior treatment) HPI  Her family patient had blood work sometime this week. They are not sure why she had the blood work. However they were told it was abnormal her potassium was high. Her family and specifically a granddaughter works at her rehabilitation facility states patient is eating well. They deny vomiting, diarrhea, dysuria, or frequency. They also relate however she wears a diaper. They state patient seems her usual self. Patient states she feels fine.  PCP Dr Renard Matter  Past Medical History  Diagnosis Date  . Hyperlipemia   . Hypothyroid   . Dementia 12/07/2011  . UTI (lower urinary tract infection) 12/07/2011    History reviewed. No pertinent past surgical history.  No family history on file.  History  Substance Use Topics  . Smoking status: Never Smoker   . Smokeless tobacco: Not on file  . Alcohol Use: No   currently in rehabilitation,  will eventually go home to live with her son  OB History    Grav Para Term Preterm Abortions TAB SAB Ect Mult Living                  Review of Systems  Unable to perform ROS: Dementia    Allergies  Review of patient's allergies indicates no known allergies.  Home Medications   Current Outpatient Rx  Name Route Sig Dispense Refill  . DONEPEZIL HCL 5 MG PO TABS Oral Take 5 mg by mouth daily.    Marland Kitchen LEVETIRACETAM 250 MG PO TABS Oral Take 1 tablet (250 mg total) by mouth 2 (two) times daily. 60 tablet 5  . LEVOTHYROXINE SODIUM 100 MCG PO TABS Oral Take 1 tablet (100 mcg total) by mouth daily. 30 tablet 5  . OLMESARTAN MEDOXOMIL 40 MG PO TABS Oral Take 40 mg by mouth daily.    Marland Kitchen POTASSIUM CHLORIDE CRYS ER 20 MEQ PO TBCR Oral Take 1  tablet (20 mEq total) by mouth 2 (two) times daily. 8 tablet 0  . SERTRALINE HCL 50 MG PO TABS Oral Take 50 mg by mouth at bedtime.    Marland Kitchen SIMVASTATIN 40 MG PO TABS Oral Take 40 mg by mouth every evening.      BP 105/54  Pulse 82  Temp 97.3 F (36.3 C) (Oral)  Resp 20  Ht 5\' 5"  (1.651 m)  Wt 95 lb (43.092 kg)  BMI 15.81 kg/m2  SpO2 97%  Vital signs normal    Physical Exam  Nursing note and vitals reviewed. Constitutional:  Non-toxic appearance. She does not appear ill. No distress.       Frail, elderly  HENT:  Head: Normocephalic and atraumatic.  Right Ear: External ear normal.  Left Ear: External ear normal.  Nose: Nose normal. No mucosal edema or rhinorrhea.  Mouth/Throat: Oropharynx is clear and moist and mucous membranes are normal. No dental abscesses or uvula swelling.  Eyes: Conjunctivae normal and EOM are normal. Pupils are equal, round, and reactive to light.  Neck: Normal range of motion and full passive range of motion without pain. Neck supple.  Cardiovascular: Normal rate, regular rhythm and normal heart sounds.  Exam reveals no gallop and no friction rub.   No murmur heard. Pulmonary/Chest: Effort normal and breath  sounds normal. No respiratory distress. She has no wheezes. She has no rhonchi. She has no rales. She exhibits no tenderness and no crepitus.  Abdominal: Soft. Normal appearance and bowel sounds are normal. She exhibits no distension. There is no tenderness. There is no rebound and no guarding.  Musculoskeletal: Normal range of motion. She exhibits no edema and no tenderness.       Moves all extremities well.   Neurological: She is alert. She has normal strength. No cranial nerve deficit.       Cooperative  Skin: Skin is warm, dry and intact. No rash noted. No erythema. No pallor.  Psychiatric: She has a normal mood and affect. Her speech is normal and behavior is normal. Her mood appears not anxious.    ED Course  Procedures (including critical care  time)   Medications  0.9 %  sodium chloride infusion (  Intravenous New Bag/Given 05/07/12 1655)  insulin aspart (novoLOG) injection 10 Units (10 Units Subcutaneous Given 05/07/12 1701)  calcium chloride 1 g in sodium chloride 0.9 % 100 mL IVPB (1 g Intravenous Given 05/07/12 1704)  sodium bicarbonate injection 50 mEq (50 mEq Intravenous Given 05/07/12 1657)  sodium polystyrene (KAYEXALATE) 15 GM/60ML suspension 60 g (60 g Oral Given 05/07/12 1702)  dextrose 50 % solution 50 mL (50 mL Intravenous Given 05/07/12 1703)     16:15 I called the lab and her blood isn't hemolyzed.   Patient treated for her hyperkalemia. She is also noted to have a new renal insufficiency.  6:00 PM Dr. Renard Matter states to admit to telemetry  Results for orders placed during the hospital encounter of 05/07/12  BASIC METABOLIC PANEL      Component Value Range   Sodium 131 (*) 135 - 145 mEq/L   Potassium 6.0 (*) 3.5 - 5.1 mEq/L   Chloride 102  96 - 112 mEq/L   CO2 20  19 - 32 mEq/L   Glucose, Bld 132 (*) 70 - 99 mg/dL   BUN 56 (*) 6 - 23 mg/dL   Creatinine, Ser 0.98 (*) 0.50 - 1.10 mg/dL   Calcium 9.5  8.4 - 11.9 mg/dL   GFR calc non Af Amer 18 (*) >90 mL/min   GFR calc Af Amer 20 (*) >90 mL/min  CBC WITH DIFFERENTIAL      Component Value Range   WBC 6.2  4.0 - 10.5 K/uL   RBC 3.93  3.87 - 5.11 MIL/uL   Hemoglobin 11.6 (*) 12.0 - 15.0 g/dL   HCT 14.7  82.9 - 56.2 %   MCV 93.4  78.0 - 100.0 fL   MCH 29.5  26.0 - 34.0 pg   MCHC 31.6  30.0 - 36.0 g/dL   RDW 13.0  86.5 - 78.4 %   Platelets 280  150 - 400 K/uL   Neutrophils Relative 59  43 - 77 %   Neutro Abs 3.7  1.7 - 7.7 K/uL   Lymphocytes Relative 25  12 - 46 %   Lymphs Abs 1.5  0.7 - 4.0 K/uL   Monocytes Relative 9  3 - 12 %   Monocytes Absolute 0.5  0.1 - 1.0 K/uL   Eosinophils Relative 7 (*) 0 - 5 %   Eosinophils Absolute 0.5  0.0 - 0.7 K/uL   Basophils Relative 0  0 - 1 %   Basophils Absolute 0.0  0.0 - 0.1 K/uL    Laboratory  interpretation all normal except hyperkalemia, new renal insufficiency, mild hyponatremia, mild anemia  Date: 05/07/2012  Rate: 107  Rhythm: sinus tachycardia  QRS Axis: normal  Intervals: normal  ST/T Wave abnormalities: normal  Conduction Disutrbances:none  Narrative Interpretation:   Old EKG Reviewed: changes noted from 04/07/2012 HR was 67     1. Hyperkalemia   2. Renal insufficiency     Plan admission  CRITICAL CARE Performed by: Devoria Albe L   Total critical care time: 40 min   Critical care time was exclusive of separately billable procedures and treating other patients.  Critical care was necessary to treat or prevent imminent or life-threatening deterioration.  Critical care was time spent personally by me on the following activities: development of treatment plan with patient and/or surrogate as well as nursing, discussions with consultants, evaluation of patient's response to treatment, examination of patient, obtaining history from patient or surrogate, ordering and performing treatments and interventions, ordering and review of laboratory studies, ordering and review of radiographic studies, pulse oximetry and re-evaluation of patient's condition.   MDM          Ward Givens, MD 05/07/12 [id number]

## 2012-05-07 NOTE — H&P (Signed)
NAME:  [name], [name]                  ACCOUNT NO.:  [id number]  MEDICAL RECORD NO.:  [id number]  LOCATION:  A319                          FACILITY:  [facility]  PHYSICIAN:  Tomoko Sandra G. Renard Matter, MD   DATE OF BIRTH:  03-Nov-1940  DATE OF ADMISSION:  05/07/2012 DATE OF DISCHARGE:  LH                             HISTORY & PHYSICAL   A 71 year old white female.  This patient resides at [facility].  She does have a prior history of dementia, hypothyroidism, and previous urinary tract infection.  She was seen yesterday on rounds and chemistries were obtained, which showed elevated potassium in critical range.  Chemistry showed a sodium of 131, potassium 6.0, chloride 102, CO2 of 20, glucose 132, BUN 56, creatinine 2.56, calcium 9.5.  She was sent to the emergency department at the hospital and was seen by emergency room physician.  Because of elevated potassium, the patient was given 1 g of sodium chloride intravenously 50 mEq of sodium bicarbonate IV, Kayexalate 15 g by suspension was given, and also D50 and 10 units of the NovoLog insulin was given.  It was noted that the patient had elevated creatinine 2.56, which had been up from previous chemistry.  It was felt she was somewhat dehydrated.  It was felt she should be admitted with these problems.  Electrocardiogram showed sinus tachycardia.  ST and T-wave abnormalities were normal.  No conduction abnormalities were noted.  SOCIAL HISTORY:  The patient does not smoke or drink alcohol.  FAMILY HISTORY:  Not available.  PAST MEDICAL HISTORY:  The patient has history of hypothyroidism, dementia, previous history of urinary tract infection, hyperlipidemia.  No surgical history available.  REVIEW OF SYSTEMS:  HEENT:  Negative.  CARDIOPULMONARY:  No cough, hemoptysis, or dyspnea.  GI:  No bowel irregularity or bleeding.  GU: No dysuria, hematuria.  ALLERGIES:  No known allergies.  ADDITIONAL INFORMATION:  From the past history, the  patient does have previous history of seizures as well.  MEDICATION LIST: 1. Donepezil hydrochloride 5 mg daily. 2. Levetiracetam 250 mg tablets 1 b.i.d. 3. Levothyroxine 100 mg daily. 4. Olmesartan medoxomil 40 mg daily. 5. Potassium chloride 20 mg b.i.d. 6. Sertraline 50 mg daily. 7. Simvastatin 40 mg daily.  PHYSICAL EXAMINATION:  GENERAL:  Alert female. VITAL SIGNS:  Blood pressure 83/40, respirations 20, pulse 82, temperature 97.8. HEENT:  PERRLA.  TMs negative.  Oropharynx benign. NECK:  Supple.  No JVD or thyroid abnormalities.  BREASTS:  No masses. HEART:  Regular rhythm.  No murmurs. LUNGS:  Clear to P and A. ABDOMEN:  No palpable organs or masses.  No organomegaly. EXTREMITIES:  Free of edema. NEUROLOGIC:  The patient is alert.  No cranial nerve abnormalities.  The patient has no motor or sensory disturbance. SKIN:  Warm and dry.  ASSESSMENT:  The patient was admitted with hyperkalemia.  She also had elevated creatinine, felt to be somewhat dehydrated.  PLAN:  To continue to monitor her chemistries.  Continue intravenous fluids.  We will recheck potassium in a.m.     Margie Urbanowicz G. Renard Matter, MD     AGM/MEDQ  D:  05/07/2012  T:  05/07/2012  Job:  [id number] 

## 2012-05-07 NOTE — ED Notes (Signed)
Pt received via [facility] secondary to hyperkalemia from [facility] per MD orders. NAD noted

## 2012-05-08 LAB — BASIC METABOLIC PANEL
BUN: 47 mg/dL — ABNORMAL HIGH (ref 6–23)
Calcium: 9.9 mg/dL (ref 8.4–10.5)
Calcium: 8.9 mg/dL (ref 8.4–10.5)
Creatinine, Ser: 2.38 mg/dL — ABNORMAL HIGH (ref 0.50–1.10)
Creatinine, Ser: 2.48 mg/dL — ABNORMAL HIGH (ref 0.50–1.10)
GFR calc Af Amer: 21 mL/min — ABNORMAL LOW (ref >90)
GFR calc non Af Amer: 19 mL/min — ABNORMAL LOW (ref >90)
GFR calc non Af Amer: 18 mL/min — ABNORMAL LOW (ref >90)
Glucose, Bld: 80 mg/dL (ref 70–99)
Potassium: 4.4 mEq/L (ref 3.5–5.1)
Sodium: 136 mEq/L (ref 135–145)

## 2012-05-08 MED ORDER — SODIUM CHLORIDE 0.9 % IJ SOLN
INTRAMUSCULAR | Status: AC
  Administered 2012-05-08: 10 mL
  Filled 2012-05-08: qty 3

## 2012-05-08 MED ORDER — ENOXAPARIN SODIUM 30 MG/0.3ML ~~LOC~~ SOLN
30.0000 mg | SUBCUTANEOUS | Status: DC
  Administered 2012-05-08 – 2012-05-10 (×3): 30 mg via SUBCUTANEOUS
  Filled 2012-05-08 (×4): qty 0.3

## 2012-05-08 MED ORDER — SODIUM CHLORIDE 0.45 % IV SOLN
INTRAVENOUS | Status: DC
  Administered 2012-05-08 – 2012-05-09 (×2): via INTRAVENOUS
  Administered 2012-05-09: 1000 mL via INTRAVENOUS
  Administered 2012-05-11: 01:00:00 via INTRAVENOUS

## 2012-05-08 MED ORDER — SODIUM POLYSTYRENE SULFONATE 15 GM/60ML PO SUSP
15.0000 g | Freq: Once | ORAL | Status: AC
  Administered 2012-05-08: 15 g via ORAL
  Filled 2012-05-08: qty 60

## 2012-05-08 NOTE — Progress Notes (Signed)
Patient potassium 5.3. Dr. Renard Matter notified. New orders to give Kayexalate 15g x1 dose.

## 2012-05-08 NOTE — Progress Notes (Signed)
Subjective: The patient is alert this a.m. She has had no seizures or arrhythmia. She was admitted with elevated potassium and creatinine. She was treated in the ED for this with D5 10 units of NovoLog insulin Kayexalate 15 g calcium chloride and sodium bicarbonate. Chemistries are to be drawn this a.m.  Objective: Vital signs in last 24 hours: Temp:  [97.3 F (36.3 C)-98.4 F (36.9 C)] 97.7 F (36.5 C) (10/16 0352) Pulse Rate:  [64-95] 64  (10/16 0352) Resp:  [20] 20  (10/16 0352) BP: (65-116)/(40-65) 93/60 mmHg (10/16 0352) SpO2:  [94 %-100 %] 99 % (10/16 0352) Weight:  [43.092 kg (95 lb)] 43.092 kg (95 lb) (10/15 1510) Weight change:  Last BM Date: 05/07/12  Intake/Output from previous day: 10/15 0701 - 10/16 0700 In: 240 [P.O.:240] Out: -  Intake/Output this shift: Total I/O In: 240 [P.O.:240] Out: -   Physical Exam: General appearance. The patient appears alert and responsive.  H. EENT negative  Lungs clear to P&A  Heart regular rhythm no murmurs  Abdomen no palpable organs or masses  Extremities free of edema   Basename 05/07/12 1525  WBC 6.2  HGB 11.6*  HCT 36.7  PLT 280   BMET  Basename 05/07/12 1525  NA 131*  K 6.0*  CL 102  CO2 20  GLUCOSE 132*  BUN 56*  CREATININE 2.56*  CALCIUM 9.5    Studies/Results: No results found.  Medications:     . sodium chloride   Intravenous Once  . calcium chloride  IV  1 g Intravenous Once  . dextrose  1 ampule Intravenous Once  . donepezil  5 mg Oral QHS  . enoxaparin  40 mg Subcutaneous Q24H  . insulin aspart  10 Units Subcutaneous Once  . irbesartan  150 mg Oral Daily  . levETIRAcetam  250 mg Oral BID  . levothyroxine  100 mcg Oral q morning - 10a  . sertraline  50 mg Oral Daily  . simvastatin  40 mg Oral QPM  . sodium bicarbonate  50 mEq Intravenous Once  . sodium polystyrene  60 g Oral Once  . DISCONTD: dextrose  1 ampule Intravenous Once       . sodium chloride 125 mL/hr at 05/07/12 2042       Assessment/Plan: The patient was admitted with hyperkalemia. Plan to continue monitor monitor chemistries and will feel more Kayexalate if needed.  The patient also has elevated creatinine and appear to be somewhat dehydrated although underlying kidney disease is still an issue. We'll obtain nephrology consult if creatinine remains elevated.  The patient has a history of hypothyroidism. We will continue to monitor this   LOS: 1 day   Hendrik Donath G 05/08/2012, 5:52 AM

## 2012-05-08 NOTE — Progress Notes (Signed)
UR Chart Review Completed  

## 2012-05-08 NOTE — Clinical Social Work Psychosocial (Signed)
Clinical Social Work Department BRIEF PSYCHOSOCIAL ASSESSMENT 05/08/2012  Patient:  [name], [name]     Account Number:  [id number]     Admit date:  05/07/2012  Clinical Social Worker:  Nancie Neas  Date/Time:  05/08/2012 02:35 PM  Referred by:  CSW  Date Referred:  05/08/2012 Referred for  SNF Placement   Other Referral:   Interview type:  Patient Other interview type:   and daughter    PSYCHOSOCIAL DATA Living Status:  FACILITY Admitted from facility:  [facility] Level of care:  Skilled Nursing Facility Primary support name:  [name] Primary support relationship to patient:  CHILD, ADULT Degree of support available:   supportive per pt    CURRENT CONCERNS Current Concerns  Post-Acute Placement   Other Concerns:    SOCIAL WORK ASSESSMENT / PLAN CSW met with pt and pt's daughter, [name] at bedside. Pt alert but confused about some details. Pt has been a resident at [facility] for the past month for rehab. She has progressed and plan was for d/c home on Friday per [name] at facility. Pt was living with her son and grandson. [name] states that decision will be up to [place] [facility] as she lives with him. CSW attempted to reach [place] [facility]  but no answer or voicemail option. [name] will get in touch with him and request for him to call CSW. Pt would like to return home if possible. [name] said family would be present around the clock. Okay for return to [facility] if needed per [name].   Assessment/plan status:  Psychosocial Support/Ongoing Assessment of Needs Other assessment/ plan:   Information/referral to community resources:   [facility]  ?CM for home health    PATIENT'S/FAMILY'S RESPONSE TO PLAN OF CARE: Pt states she hopes to return home from hospital. Awaiting call from son to determine if this is possible. CSW to continue to follow and will update [facility] as appropriate.        [name], [place] [phone]

## 2012-05-08 NOTE — Plan of Care (Signed)
Problem: Phase I Progression Outcomes Goal: OOB as tolerated unless otherwise ordered Outcome: Not Progressing Refuses to get out of bed     

## 2012-05-09 ENCOUNTER — Observation Stay (HOSPITAL_COMMUNITY): Payer: Medicare Other

## 2012-05-09 NOTE — Clinical Social Work Note (Signed)
Pt's son Reita Cliche present this morning and states that plan was for pt to return home from [facility] on 10/22. He requests for pt to return to SNF to complete rehab. [facility] updated and agreeable. CSW to continue to follow.  Derenda Fennel, [place] [phone]

## 2012-05-09 NOTE — Plan of Care (Signed)
Problem: Phase I Progression Outcomes Goal: OOB as tolerated unless otherwise ordered Outcome: Not Progressing Refuses to get out of bed

## 2012-05-09 NOTE — Consult Note (Signed)
Reason for Consult: Hyperkalemia and acute on chronic renal failure. Referring Physician: Dr. [name] is an 71 y.o. female.  HPI: Patient with history of hypertension, hypothyroidism and hypokalemia who was on potassium supplement presently admitted because of her worsening of renal failure and hyperkalemia. Presently patient denies any previous history of renal failure no history of her kidney stone. Since the patient has history of dementia as this moment not sure what her baseline mental status is. Patient denies any difficulty breathing. Appetite is good and she doesn't have any nausea vomiting no diarrhea.  Past Medical History  Diagnosis Date  . Hyperlipemia   . Hypothyroid   . Dementia 12/07/2011  . UTI (lower urinary tract infection) 12/07/2011    History reviewed. No pertinent past surgical history.  No family history on file.  Social History:  reports that she has never smoked. She does not have any smokeless tobacco history on file. She reports that she does not drink alcohol or use illicit drugs.  Allergies: No Known Allergies  Medications: I have reviewed the patient's current medications.  Results for orders placed during the hospital encounter of 05/07/12 (from the past 48 hour(s))  BASIC METABOLIC PANEL     Status: Abnormal   Collection Time   05/07/12  3:25 PM      Component Value Range Comment   Sodium 131 (*) 135 - 145 mEq/L    Potassium 6.0 (*) 3.5 - 5.1 mEq/L    Chloride 102  96 - 112 mEq/L    CO2 20  19 - 32 mEq/L    Glucose, Bld 132 (*) 70 - 99 mg/dL    BUN 56 (*) 6 - 23 mg/dL    Creatinine, Ser 1.61 (*) 0.50 - 1.10 mg/dL    Calcium 9.5  8.4 - 09.6 mg/dL    GFR calc non Af Amer 18 (*) >90 mL/min    GFR calc Af Amer 20 (*) >90 mL/min   CBC WITH DIFFERENTIAL     Status: Abnormal   Collection Time   05/07/12  3:25 PM      Component Value Range Comment   WBC 6.2  4.0 - 10.5 K/uL    RBC 3.93  3.87 - 5.11 MIL/uL    Hemoglobin 11.6 (*) 12.0 -  15.0 g/dL    HCT 04.5  40.9 - 81.1 %    MCV 93.4  78.0 - 100.0 fL    MCH 29.5  26.0 - 34.0 pg    MCHC 31.6  30.0 - 36.0 g/dL    RDW 91.4  78.2 - 95.6 %    Platelets 280  150 - 400 K/uL    Neutrophils Relative 59  43 - 77 %    Neutro Abs 3.7  1.7 - 7.7 K/uL    Lymphocytes Relative 25  12 - 46 %    Lymphs Abs 1.5  0.7 - 4.0 K/uL    Monocytes Relative 9  3 - 12 %    Monocytes Absolute 0.5  0.1 - 1.0 K/uL    Eosinophils Relative 7 (*) 0 - 5 %    Eosinophils Absolute 0.5  0.0 - 0.7 K/uL    Basophils Relative 0  0 - 1 %    Basophils Absolute 0.0  0.0 - 0.1 K/uL   URINALYSIS, ROUTINE W REFLEX MICROSCOPIC     Status: Abnormal   Collection Time   05/07/12  6:32 PM      Component Value Range Comment  Color, Urine STRAW (*) YELLOW    APPearance CLEAR  CLEAR    Specific Gravity, Urine <1.005 (*) 1.005 - 1.030    pH 6.0  5.0 - 8.0    Glucose, UA 100 (*) NEGATIVE mg/dL    Hgb urine dipstick NEGATIVE  NEGATIVE    Bilirubin Urine NEGATIVE  NEGATIVE    Ketones, ur NEGATIVE  NEGATIVE mg/dL    Protein, ur NEGATIVE  NEGATIVE mg/dL    Urobilinogen, UA 0.2  0.0 - 1.0 mg/dL    Nitrite NEGATIVE  NEGATIVE    Leukocytes, UA NEGATIVE  NEGATIVE MICROSCOPIC NOT DONE ON URINES WITH NEGATIVE PROTEIN, BLOOD, LEUKOCYTES, NITRITE, OR GLUCOSE <1000 mg/dL.  BASIC METABOLIC PANEL     Status: Abnormal   Collection Time   05/08/12  5:03 AM      Component Value Range Comment   Sodium 136  135 - 145 mEq/L    Potassium 5.3 (*) 3.5 - 5.1 mEq/L    Chloride 108  96 - 112 mEq/L    CO2 20  19 - 32 mEq/L    Glucose, Bld 80  70 - 99 mg/dL    BUN 48 (*) 6 - 23 mg/dL    Creatinine, Ser 1.61 (*) 0.50 - 1.10 mg/dL    Calcium 9.9  8.4 - 09.6 mg/dL    GFR calc non Af Amer 19 (*) >90 mL/min    GFR calc Af Amer 22 (*) >90 mL/min   BASIC METABOLIC PANEL     Status: Abnormal   Collection Time   05/08/12  4:45 PM      Component Value Range Comment   Sodium 138  135 - 145 mEq/L    Potassium 4.4  3.5 - 5.1 mEq/L DELTA CHECK  NOTED   Chloride 107  96 - 112 mEq/L    CO2 24  19 - 32 mEq/L    Glucose, Bld 95  70 - 99 mg/dL    BUN 47 (*) 6 - 23 mg/dL    Creatinine, Ser 0.45 (*) 0.50 - 1.10 mg/dL    Calcium 8.9  8.4 - 40.9 mg/dL    GFR calc non Af Amer 18 (*) >90 mL/min    GFR calc Af Amer 21 (*) >90 mL/min     No results found.  Review of Systems  Respiratory: Negative for cough.   Cardiovascular: Negative for orthopnea and leg swelling.  Gastrointestinal: Negative for nausea, vomiting and diarrhea.   Blood pressure 98/63, pulse 72, temperature 99.1 F (37.3 C), temperature source Oral, resp. rate 18, height 5\' 5"  (1.651 m), weight 41.3 kg (91 lb 0.8 oz), SpO2 90.00%. Physical Exam  Neck: No JVD present.  Cardiovascular: Normal rate and regular rhythm.   No murmur heard. Respiratory: No respiratory distress. She has no wheezes. She has no rales.  GI: She exhibits no distension. There is no tenderness.  Musculoskeletal: She exhibits no edema.    Assessment/Plan: Problem #1 hyperkalemia possibly a combination of potassium supplement and the also ARBS. Presently her potassium has normalized. Problem #2 renal failure. At this moment seems to be acute on chronic. Her creatinine was 1.72 and 12/31/2010 however improved to 1.07 on 12/07/2011. During that time he is GFR was 50 cc per minute hence patient might have underlying stage II or stage III chronic renal failure. The present increasing BUN and creatinine most likely could be secondary to prerenal syndrome. At this moment her renal function slightly better. Has this moment interstitial nephritis spatially was elevated  is now feels cannot ruled out. ATN also need to be put in the differential diagnosis. Problem #3 history of recurrent tract infection Problem #4 history of hypothyroidism Problem #5 history of hypertension Problem #6 history of dementia Problem #7 history of seizure disorder Plan: Thank you his hydration We'll check fractional excretion of  sodium and the also urine for hansel stain We'll follow patient input and output. We'll check her ultrasound of the kidneys. We'll follow her basic metabolic panel, vitamin D level and also intact PTH.  [name] S 05/09/2012, 8:08 AM

## 2012-05-09 NOTE — Care Management Note (Unsigned)
    Page 1 of 1   05/09/2012     1:40:23 PM   CARE MANAGEMENT NOTE 05/09/2012  Patient:  [name], [name]   Account Number:  [id number]  Date Initiated:  05/09/2012  Documentation initiated by:  Sharrie Rothman  Subjective/Objective Assessment:   Pt admitted from [facility] with hyperkalemia. Pt is resident at [facility] and will return to [facility] at discharge.     Action/Plan:   CSW will arrange discharge to facility when medically stable.   Anticipated DC Date:  05/11/2012   Anticipated DC Plan:  SKILLED NURSING FACILITY  In-house referral  Clinical Social Worker      DC Planning Services  CM consult      Choice offered to / List presented to:             Status of service:  Completed, signed off Medicare Important Message given?   (If response is "NO", the following Medicare IM given date fields will be blank) Date Medicare IM given:   Date Additional Medicare IM given:    Discharge Disposition:    Per UR Regulation:    If discussed at Long Length of Stay Meetings, dates discussed:    Comments:  05/09/12 1337 Arlyss Queen, RN BSN CM

## 2012-05-09 NOTE — Progress Notes (Signed)
Subjective: The patient remains alert she's had no further seizures or arrhythmia she was admitted with elevated potassium and creatinine was treated in ED for this with the D50 and insulin along with Kayexalate.  She remains on IV fluids and still has elevated creatinine   Objective: Vital signs in last 24 hours: Temp:  [97.8 F (36.6 C)-97.9 F (36.6 C)] 97.8 F (36.6 C) (10/16 2100) Pulse Rate:  [84-86] 86  (10/16 2100) Resp:  [18-20] 18  (10/16 2100) BP: (96-98)/(51-62) 96/62 mmHg (10/16 2100) SpO2:  [94 %-98 %] 94 % (10/16 2100) Weight change:  Last BM Date: 05/08/12  Intake/Output from previous day: 10/16 0701 - 10/17 0700 In: 1235 [P.O.:360; I.V.:875] Out: -  Intake/Output this shift:    Physical Exam: H. EENT negative Lungs clear to P&A  Heart regular rhythm no murmurs  Abdomen no palpable organs or masses  Extremities free of edema    Basename 05/07/12 1525  WBC 6.2  HGB 11.6*  HCT 36.7  PLT 280   BMET  Basename 05/08/12 1645 05/08/12 0503  NA 138 136  K 4.4 5.3*  CL 107 108  CO2 24 20  GLUCOSE 95 80  BUN 47* 48*  CREATININE 2.48* 2.38*  CALCIUM 8.9 9.9   The patient remains alert she's had no seizures or arrhythmia he received packed and a dose of Kayexalate yesterday. Her potassium now is 4.4 but creatinine remains    Studies/Results: No results found.  Medications:     . donepezil  5 mg Oral QHS  . enoxaparin  30 mg Subcutaneous Q24H  . irbesartan  150 mg Oral Daily  . levETIRAcetam  250 mg Oral BID  . levothyroxine  100 mcg Oral q morning - 10a  . sertraline  50 mg Oral Daily  . simvastatin  40 mg Oral QPM  . sodium chloride      . sodium polystyrene  15 g Oral Once  . DISCONTD: enoxaparin  40 mg Subcutaneous Q24H       . sodium chloride 1,000 mL (05/09/12 0225)  . DISCONTD: sodium chloride 125 mL/hr at 05/07/12 2042     Assessment/Plan: The patient was admitted with hyperkalemia she did have extra Kayexalate and her  potassium is now more normal  She also has elevated creatinine she was somewhat dehydrated and has received intravenous fluids. Underlying renal disease is still an issue. Will obtain nephrology consult.   LOS: 2 days   Amir Glaus G 05/09/2012, 6:22 AM

## 2012-05-10 LAB — BASIC METABOLIC PANEL
BUN: 32 mg/dL — ABNORMAL HIGH (ref 6–23)
CO2: 20 mEq/L (ref 19–32)
Chloride: 106 mEq/L (ref 96–112)
GFR calc Af Amer: 32 mL/min — ABNORMAL LOW (ref >90)
Potassium: 4.5 mEq/L (ref 3.5–5.1)

## 2012-05-10 LAB — PHOSPHORUS: Phosphorus: 3.3 mg/dL (ref 2.3–4.6)

## 2012-05-10 MED ORDER — ADULT MULTIVITAMIN W/MINERALS CH
1.0000 | ORAL_TABLET | Freq: Every day | ORAL | Status: DC
  Administered 2012-05-10 – 2012-05-11 (×2): 1 via ORAL
  Filled 2012-05-10 (×2): qty 1

## 2012-05-10 MED ORDER — BOOST / RESOURCE BREEZE PO LIQD
1.0000 | Freq: Two times a day (BID) | ORAL | Status: DC
  Administered 2012-05-10 – 2012-05-11 (×2): 1 via ORAL
  Filled 2012-05-10 (×7): qty 1

## 2012-05-10 MED ORDER — SODIUM CHLORIDE 0.9 % IJ SOLN
INTRAMUSCULAR | Status: AC
  Administered 2012-05-10: 3 mL
  Filled 2012-05-10: qty 6

## 2012-05-10 NOTE — Progress Notes (Signed)
Subjective: This patient was admitted with the hyperkalemia and increased creatinine. She is resting comfortably at the present. Her potassium has normalized. But she still has elevated creatinine yesterday her creatinine was 2.48 with a BUN of 47. She is being evaluated by nephrology  Objective: Vital signs in last 24 hours: Temp:  [98 F (36.7 C)-98.1 F (36.7 C)] 98 F (36.7 C) (10/17 2146) Pulse Rate:  [74-95] 74  (10/17 2146) Resp:  [20-26] 20  (10/17 2146) BP: (78-113)/(41-62) 113/55 mmHg (10/17 2146) SpO2:  [98 %-99 %] 99 % (10/17 2146) Weight change:  Last BM Date: 05/08/12  Intake/Output from previous day: 10/17 0701 - 10/18 0700 In: 360 [P.O.:360] Out: -  Intake/Output this shift:    Physical Exam: Blood pressure 113/55 respirations 20 pulse 94  HEENT negative  Neck supple no JVD or thyroid abnormalities  Heart regular rhythm no murmurs  Lungs clear to P&A  Abdomen no palpable organs or masses no evidence of peripheral edema   Basename 05/07/12 1525  WBC 6.2  HGB 11.6*  HCT 36.7  PLT 280   BMET  Basename 05/08/12 1645 05/08/12 0503  NA 138 136  K 4.4 5.3*  CL 107 108  CO2 24 20  GLUCOSE 95 80  BUN 47* 48*  CREATININE 2.48* 2.38*  CALCIUM 8.9 9.9    Studies/Results: US Renal  05/09/2012  *RADIOLOGY REPORT*  Clinical Data:  Elevated creatinine  RENAL/URINARY TRACT ULTRASOUND COMPLETE  Comparison:  None.  Findings:  Right Kidney:  The right kidney measures 8.8 cm in greatest dimension.  Multiple cystic lesions are identified.  The largest of these measures 1.4 cm in greatest dimension.  No hydronephrosis is noted.  Left Kidney:  Left kidney measures 9.3 cm in greatest length.   No mass lesion is identified.  No hydronephrosis is seen.  Bladder:  Appears normal for degree of bladder distention.  IMPRESSION: Right renal cystic change.  No acute abnormality is noted.   Original Report Authenticated By: Phillips Odor, M.D.     Medications:     .  donepezil  5 mg Oral QHS  . enoxaparin  30 mg Subcutaneous Q24H  . irbesartan  150 mg Oral Daily  . levETIRAcetam  250 mg Oral BID  . levothyroxine  100 mcg Oral q morning - 10a  . sertraline  50 mg Oral Daily  . simvastatin  40 mg Oral QPM       . sodium chloride 125 mL/hr at 05/09/12 1600     Assessment/Plan: 1. The patient was admitted with hyperkalemia which is normalized.  2. The patient does have acute on chronic renal failure with elevated creatinine. This is being monitored and be met will be done today. Patient also is being seen by nephrology   She does have hypothyroidism which is been treated appropriately.  She also has seizure disorder present no further seizures.  Patient is scheduled for ultrasound of kidneys.   LOS: 3 days   Osha Errico G 05/10/2012, 6:26 AM

## 2012-05-10 NOTE — Progress Notes (Signed)
Subjective: Interval History: has no complaint of nausea or vomiting. Patient denies any difficulty breathing. Presently patient offers no complaints..  Objective: Vital signs in last 24 hours: Temp:  [97.7 F (36.5 C)-98.1 F (36.7 C)] 98.1 F (36.7 C) (10/18 0751) Pulse Rate:  [66-95] 66  (10/18 0751) Resp:  [20-26] 20  (10/18 0751) BP: (78-120)/(41-72) 120/72 mmHg (10/18 0751) SpO2:  [98 %-100 %] 100 % (10/18 0649) Weight:  [42.2 kg (93 lb 0.6 oz)] 42.2 kg (93 lb 0.6 oz) (10/18 0649) Weight change: 0.9 kg (1 lb 15.8 oz)  Intake/Output from previous day: 10/17 0701 - 10/18 0700 In: 360 [P.O.:360] Out: -  Intake/Output this shift:   generally patient is alert in no apparent distress Chest is clear to auscultation no rales or rhonchi or egophony.  Heart exam regular rate and rhythm no murmur Abdomen soft positive bowel sounds Extremities no edema.    Lab Results:  [employer] 05/07/12 1525  WBC 6.2  HGB 11.6*  HCT 36.7  PLT 280   BMET:  Basename 05/10/12 0511 05/08/12 1645  NA 134* 138  K 4.5 4.4  CL 106 107  CO2 20 24  GLUCOSE 79 95  BUN 32* 47*  CREATININE 1.75* 2.48*  CALCIUM 8.6 8.9   No results found for this basename: PTH:2 in the last 72 hours Iron Studies: No results found for this basename: IRON,TIBC,TRANSFERRIN,FERRITIN in the last 72 hours  Studies/Results: US Renal  05/09/2012  *RADIOLOGY REPORT*  Clinical Data:  Elevated creatinine  RENAL/URINARY TRACT ULTRASOUND COMPLETE  Comparison:  None.  Findings:  Right Kidney:  The right kidney measures 8.8 cm in greatest dimension.  Multiple cystic lesions are identified.  The largest of these measures 1.4 cm in greatest dimension.  No hydronephrosis is noted.  Left Kidney:  Left kidney measures 9.3 cm in greatest length.   No mass lesion is identified.  No hydronephrosis is seen.  Bladder:  Appears normal for degree of bladder distention.  IMPRESSION: Right renal cystic change.  No acute abnormality is noted.    Original Report Authenticated By: Phillips Odor, M.D.     I have reviewed the patient's current medications.  Assessment/Plan: Problem #1 acute kidney injury superimposed on chronic her BUN is 32 creatinine is 1.75 renal function seems to be improving. Her renal ultrasound didn't show any hydro-. She is bilaterally small kidneys consistent with  underlying chronic renal failure. Problem #2 hyperkalemia potassium is 4.5 corrected Problem #3 hypertension her blood pressure seems to be controlled very well Problem #5 history of hypothyroidism she is on Synthroid Problem #6 history of hypercholesterolemia Problem #7 history of urinary tract  infection Problem #8 history of dementia. Plan: We'll decrease her IV fluid to 100 cc per hour We'll continue his other medication and we'll check her basic metabolic panel in the morning.    LOS: 3 days   Glen Blatchley S 05/10/2012,7:59 AM

## 2012-05-10 NOTE — Progress Notes (Signed)
INITIAL ADULT NUTRITION ASSESSMENT Date: 05/10/2012   Time: 10:24 AM Reason for Assessment: Underweight  ASSESSMENT: Female 71 y.o.  Dx: Hyperkalemia, Chronic Renal failure   Past Medical History  Diagnosis Date  . Hyperlipemia   . Hypothyroid   . Dementia 12/07/2011  . UTI (lower urinary tract infection) 12/07/2011    Scheduled Meds:   . donepezil  5 mg Oral QHS  . enoxaparin  30 mg Subcutaneous Q24H  . irbesartan  150 mg Oral Daily  . levETIRAcetam  250 mg Oral BID  . levothyroxine  100 mcg Oral q morning - 10a  . sertraline  50 mg Oral Daily  . simvastatin  40 mg Oral QPM   Continuous Infusions:   . sodium chloride 125 mL/hr at 05/09/12 1600   PRN Meds:.  Ht: 5\' 5"  (165.1 cm)  Wt: 93 lb 0.6 oz (42.2 kg)  Ideal Wt: 57 kg  % Ideal Wt:74%  Usual Wt:  Wt Readings from Last 10 Encounters:  05/10/12 93 lb 0.6 oz (42.2 kg)  04/09/12 89 lb 11.6 oz (40.7 kg)  12/10/11 90 lb 6.4 oz (41.005 kg)     Body mass index is 15.48 kg/(m^2).Underweight  Food/Nutrition Related Hx: Pt is very pleasant lady who lives at [facility].Chart reviewed. Up in chair. Reports wt is stable and appetite is good. Usually eats ~ 50% of meals and is fed by staff. Poor dentition. Would likely benefit from Mechanically Soft foods. She doesn't like milk; will send Resource Breeze between meals for improved nutritional intake.   CMP     Component Value Date/Time   NA 134* 05/10/2012 0511   K 4.5 05/10/2012 0511   CL 106 05/10/2012 0511   CO2 20 05/10/2012 0511   GLUCOSE 79 05/10/2012 0511   BUN 32* 05/10/2012 0511   CREATININE 1.75* 05/10/2012 0511   CALCIUM 8.6 05/10/2012 0511   PROT 7.8 04/07/2012 2205   ALBUMIN 3.4* 04/07/2012 2205   AST 20 04/07/2012 2205   ALT 9 04/07/2012 2205   ALKPHOS 135* 04/07/2012 2205   BILITOT 0.3 04/07/2012 2205   GFRNONAA 28* 05/10/2012 0511   GFRAA 32* 05/10/2012 0511    Intake/Output Summary (Last 24 hours) at 05/10/12 1030 Last data filed at 05/10/12  0804  Gross per 24 hour  Intake    360 ml  Output      0 ml  Net    360 ml      Diet Order: General  Supplements/Tube Feeding:none at this time  IVF:    sodium chloride Last Rate: 125 mL/hr at 05/09/12 1600    Estimated Nutritional Needs:   Kcal:1260-1470 kcal Protein:50-59 gr Fluid:1 ml/kcal  NUTRITION DIAGNOSIS: -Increased nutrient needs (NI-5.1).  Status: Ongoing  RELATED TO: meets underweight criteria  AS EVIDENCE BY: BMI=-15.4 (increased need for calories and protein to promote wt gain)  MONITORING/EVALUATION(Goals): Monitor po meal and suppl, labs Goal: Pt to meet >/= 90% of their estimated nutrition needs; not met  EDUCATION NEEDS: -No education needs identified at this time  INTERVENTION: -Resource Breeze po BID, each supplement provides 250 kcal and 9 grams of protein. -Add MVI daily -Provide Mechanical Soft foods to promote oral intake -Encourage meal and supplement intake  -Fed by staff  Dietitian [phone]  DOCUMENTATION CODES Per approved criteria  -Underweight    Demetreus Lothamer, Charlann Noss 05/10/2012, 10:24 AM

## 2012-05-10 NOTE — Clinical Social Work Note (Signed)
Per Eunice Blase at [facility], family now are saying they plan to take pt home from hospital. CSW attempted to reach family to confirm this, but had to leave voicemail. CSW will notify CM in order to set up home health for potential weekend d/c.    Derenda Fennel, LCSW [phone]

## 2012-05-10 NOTE — Clinical Social Work Note (Signed)
CSW spoke with MD about pt. Not ready today, but possibly over weekend. MD is aware facility can accept pt back anytime. Debbie at [facility] updated.  Derenda Fennel, [place] [phone]

## 2012-05-11 LAB — BASIC METABOLIC PANEL
Chloride: 107 mEq/L (ref 96–112)
GFR calc Af Amer: 38 mL/min — ABNORMAL LOW (ref >90)
Potassium: 4.2 mEq/L (ref 3.5–5.1)

## 2012-05-11 NOTE — Discharge Summary (Signed)
NAME:  [name], [name]                  ACCOUNT NO.:  [id number]  MEDICAL RECORD NO.:  [id number]  LOCATION:  A319                          FACILITY:  [facility]  PHYSICIAN:  Bonnetta Allbee G. Renard Matter, MD   DATE OF BIRTH:  [birth year]  DATE OF ADMISSION:  05/07/2012 DATE OF DISCHARGE:  10/19/2013LH                              DISCHARGE SUMMARY   This 71 year old white female was admitted May 07, 2012, discharged May 11, 2012, 4 days hospitalization.  DIAGNOSES: 1. Hyperkalemia. 2. Acute on chronic renal failure. 3. Dehydration. 4. Hypothyroidism. 5. Dementia. 6. Hyperlipidemia. 7. Urinary tract infection.  CONDITION:  Stable and improved at the time of her discharge.  This patient, who normally resides at [facility].  Had prior history of dementia, hypothyroidism, and previous urinary tract infection.  She had been seen prior to admission and found to have an elevated potassium in critical range.  Her potassium was 6.0.  Chemistry showed a sodium of 131, potassium 6.0, chloride 102, CO2 of 20, glucose 132, BUN 56, creatinine 2.56, calcium 9.5.  She was sent to the emergency department of the hospital and was seen in the emergency room by ED physician because of elevated potassium.  The patient was given 1 g of sodium chloride intravenously, 50 mEq of sodium bicarbonate intravenously, Kayexalate 15 g by suspension was given.  She was also given 10 units of NovoLog insulin and D50 50 mL.  It was felt she was somewhat dehydrated and that she should be admitted.  EKG was essentially normal.  PHYSICAL EXAMINATION:  GENERAL:  Alert female. VITAL SIGNS:  Blood pressure 83/40, respirations 20, pulse 82, temp 97.8. HEENT:  PERRLA.  TM negative.  Oropharynx benign. NECK:  Supple.  No JVD or thyroid abnormalities. BREASTS:  No masses. HEART:  Regular rhythm.  No murmurs. LUNGS:  Clear to P and A. ABDOMEN:  No palpable organs or masses.  No organomegaly. EXTREMITIES:  Free of  edema. NEUROLOGIC:  The patient is alert.  No cranial nerve abnormalities.  The patient has no motor or sensory disturbance.  Reflexes equal and normal. SKIN:  Warm and dry.  LABORATORY DATA:  Chemistries on admission, sodium 138, potassium 6.0. CBC on admission, WBC 6200 with hemoglobin 11.6, hematocrit 36.7. Chemistries on May 11, 2012, sodium 130, potassium 4.2, chloride 107, CO2 of 21, BUN 23, creatinine 1.52, calcium 9.2, GFR 33.  Glucose 73, phosphorus 3.3, alkaline phosphatase 135.  Urinalysis negative. Chemistries prior to admission showed a potassium of 6.0. More recent chemistries, chloride 107, CO2 of 21, BUN 23, creatinine 1.52, glucose 73.  Renal ultrasound shows right renal cystic changes.  No acute abnormalities noted.  Left kidney measures 9.3 cm in greatest length. No mass lesion identified.  No hydronephrosis.  HOSPITAL COURSE:  The patient on admission was placed on intravenous fluids, half-normal saline.  She was continued on following medications: Donepezil 5 mg at bedtime.  She was given subcutaneous Lovenox 30 mg subcutaneously every 24 hours, feeding supplement 1 container b.i.d., irbesartan 150 mg daily, levetiracetam 250 mg b.i.d., levothyroxine 100 mg daily, multivitamin 1 tablet daily, sertraline 50 mg daily, simvastatin 40 mg daily.  The patient slowly  improved throughout her hospital stay.  Her chemistries were monitored and her potassium returned to normal.  The creatinine gradually returned to a better range.  She was seen in consultation by Nephrology, Dr. Kristian Covey who felt that the problem was possibly combination of potassium supplement and ARBS.  He felt that the renal failure was acute on chronic.  He did recommend renal ultrasound, which showed cystic changes in kidneys.  No hydronephrosis.  After showing improvement, it was felt she could be discharged home.  Family stated they would rather take her home rather than place her back  in nursing facility.  Her chemistries should be continued to be monitored.     Nickolis Diel G. Renard Matter, MD     AGM/MEDQ  D:  05/11/2012  T:  05/11/2012  Job:  [id number]

## 2012-05-11 NOTE — Progress Notes (Signed)
Subjective: Interval History: has no complaint of nausea or vomiting. Patient has this moment offers no complaints..  Objective: Vital signs in last 24 hours: Temp:  [97.3 F (36.3 C)-97.9 F (36.6 C)] 97.5 F (36.4 C) (10/19 0500) Pulse Rate:  [62-68] 62  (10/19 0500) Resp:  [16-20] 18  (10/19 0500) BP: (95-114)/(52-68) 114/68 mmHg (10/19 0500) SpO2:  [97 %-99 %] 97 % (10/19 0500) Weight change:   Intake/Output from previous day: 10/18 0701 - 10/19 0700 In: 820 [P.O.:370; I.V.:450] Out: -  Intake/Output this shift: Total I/O In: 745 [I.V.:745] Out: -   General appearance: alert, cooperative and no distress Resp: clear to auscultation bilaterally Cardio: regular rate and rhythm, S1, S2 normal, no murmur, click, rub or gallop GI: soft, non-tender; bowel sounds normal; no masses,  no organomegaly Extremities: extremities normal, atraumatic, no cyanosis or edema  Lab Results: No results found for this basename: WBC:2,HGB:2,HCT:2,PLT:2 in the last 72 hours BMET:  Basename 05/11/12 0559 05/10/12 0511  NA 138 134*  K 4.2 4.5  CL 107 106  CO2 21 20  GLUCOSE 73 79  BUN 23 32*  CREATININE 1.52* 1.75*  CALCIUM 9.2 8.6   No results found for this basename: PTH:2 in the last 72 hours Iron Studies: No results found for this basename: IRON,TIBC,TRANSFERRIN,FERRITIN in the last 72 hours  Studies/Results: US Renal  05/09/2012  *RADIOLOGY REPORT*  Clinical Data:  Elevated creatinine  RENAL/URINARY TRACT ULTRASOUND COMPLETE  Comparison:  None.  Findings:  Right Kidney:  The right kidney measures 8.8 cm in greatest dimension.  Multiple cystic lesions are identified.  The largest of these measures 1.4 cm in greatest dimension.  No hydronephrosis is noted.  Left Kidney:  Left kidney measures 9.3 cm in greatest length.   No mass lesion is identified.  No hydronephrosis is seen.  Bladder:  Appears normal for degree of bladder distention.  IMPRESSION: Right renal cystic change.  No acute  abnormality is noted.   Original Report Authenticated By: Phillips Odor, M.D.     I have reviewed the patient's current medications.  Assessment/Plan: Problem #1 renal failure acute on chronic her BUN is 23 and creatinine is 1.5 to renal function seems to be improving. Problem #2 hyperkalemia potassium is 4.2 corrected. Problem #3 hypothyroidism she is on Synthroid Problem #4 urine tract infection she is a febrile Problem #5 history of hypertension blood pressure seems to be reasonably controlled Problem #6 history of dementia Problem #7 history of arthritis  Plan: We'll decrease her IV fluid to 50 cc per hour We'll check her basic metabolic panel If patient is going to be discharged we'll follow her as an outpatient.   LOS: 4 days   [name] S 05/11/2012,8:13 AM

## 2012-05-11 NOTE — Discharge Summary (Signed)
NAME:  [name], [name]                  ACCOUNT NO.:  [id number]  MEDICAL RECORD NO.:  [id number]  LOCATION:  A319                          FACILITY:  [facility]  PHYSICIAN:  Marijo Quizon G. Renard Matter, MD   DATE OF BIRTH:  May 25, 1941  DATE OF ADMISSION:  05/07/2012 DATE OF DISCHARGE:  [facility]                              DISCHARGE SUMMARY   ADDENDUM:  The patient will be continuing to take the following medications: 1. Zocor 40 mg daily. 2. Aricept 5 mg daily at bedtime. 3. Benicar 40 mg daily. 4. Zoloft 50 mg daily. 5. Keppra 250 mg b.i.d. 6. Synthroid 100 mcg daily.  DIAGNOSIS:  Seizure disorder.  The patient was stable at time of discharge.     Reighan Hipolito G. Renard Matter, MD     AGM/MEDQ  D:  05/11/2012  T:  05/11/2012  Job:  [id number]

## 2012-05-11 NOTE — Progress Notes (Signed)
D/c instructions reviewed with patient's son.  Verbalized understanding.  Pt dc'd to home with son.  Schonewitz, Candelaria Stagers 05/11/2012

## 2012-10-06 ENCOUNTER — Emergency Department (HOSPITAL_COMMUNITY)
Admission: EM | Admit: 2012-10-06 | Discharge: 2012-10-06 | Disposition: A | Discharge status: Home / Self Care | Payer: Medicare Other | Attending: Emergency Medicine | Admitting: Emergency Medicine

## 2012-10-06 ENCOUNTER — Other Ambulatory Visit: Payer: Self-pay

## 2012-10-06 ENCOUNTER — Encounter (HOSPITAL_COMMUNITY): Payer: Self-pay

## 2012-10-06 ENCOUNTER — Emergency Department (HOSPITAL_COMMUNITY): Payer: Medicare Other

## 2012-10-06 DIAGNOSIS — Z79899 Other long term (current) drug therapy: Secondary | ICD-10-CM | POA: Insufficient documentation

## 2012-10-06 DIAGNOSIS — R5381 Other malaise: Secondary | ICD-10-CM | POA: Insufficient documentation

## 2012-10-06 DIAGNOSIS — E876 Hypokalemia: Secondary | ICD-10-CM | POA: Insufficient documentation

## 2012-10-06 DIAGNOSIS — R4182 Altered mental status, unspecified: Secondary | ICD-10-CM

## 2012-10-06 DIAGNOSIS — E039 Hypothyroidism, unspecified: Secondary | ICD-10-CM | POA: Insufficient documentation

## 2012-10-06 DIAGNOSIS — R5383 Other fatigue: Secondary | ICD-10-CM | POA: Insufficient documentation

## 2012-10-06 DIAGNOSIS — F0390 Unspecified dementia without behavioral disturbance: Secondary | ICD-10-CM | POA: Insufficient documentation

## 2012-10-06 DIAGNOSIS — N390 Urinary tract infection, site not specified: Secondary | ICD-10-CM | POA: Insufficient documentation

## 2012-10-06 DIAGNOSIS — E785 Hyperlipidemia, unspecified: Secondary | ICD-10-CM | POA: Insufficient documentation

## 2012-10-06 DIAGNOSIS — R404 Transient alteration of awareness: Secondary | ICD-10-CM | POA: Insufficient documentation

## 2012-10-06 LAB — URINALYSIS, ROUTINE W REFLEX MICROSCOPIC
Bilirubin Urine: NEGATIVE
Glucose, UA: NEGATIVE mg/dL
Ketones, ur: NEGATIVE mg/dL
Leukocytes, UA: NEGATIVE
pH: 6 (ref 5.0–8.0)

## 2012-10-06 LAB — CBC WITH DIFFERENTIAL/PLATELET
HCT: 39.8 % (ref 36.0–46.0)
Hemoglobin: 12.8 g/dL (ref 12.0–15.0)
Lymphocytes Relative: 16 % (ref 12–46)
Lymphs Abs: 1.2 10*3/uL (ref 0.7–4.0)
Monocytes Absolute: 1 10*3/uL (ref 0.1–1.0)
Monocytes Relative: 13 % — ABNORMAL HIGH (ref 3–12)
Neutro Abs: 5.5 10*3/uL (ref 1.7–7.7)
WBC: 7.8 10*3/uL (ref 4.0–10.5)

## 2012-10-06 LAB — BASIC METABOLIC PANEL
BUN: 19 mg/dL (ref 6–23)
CO2: 26 mEq/L (ref 19–32)
Chloride: 97 mEq/L (ref 96–112)
Creatinine, Ser: 0.88 mg/dL (ref 0.50–1.10)
Glucose, Bld: 130 mg/dL — ABNORMAL HIGH (ref 70–99)

## 2012-10-06 LAB — URINE MICROSCOPIC-ADD ON

## 2012-10-06 MED ORDER — SODIUM CHLORIDE 0.9 % IV BOLUS (SEPSIS)
1000.0000 mL | Freq: Once | INTRAVENOUS | Status: AC
  Administered 2012-10-06: 1000 mL via INTRAVENOUS

## 2012-10-06 MED ORDER — POTASSIUM CHLORIDE 10 MEQ/100ML IV SOLN
10.0000 meq | INTRAVENOUS | Status: DC
  Administered 2012-10-06: 10 meq via INTRAVENOUS
  Filled 2012-10-06: qty 100

## 2012-10-06 MED ORDER — SULFAMETHOXAZOLE-TRIMETHOPRIM 800-160 MG PO TABS: 1.0000 | ORAL_TABLET | Freq: Two times a day (BID) | ORAL | Status: DC

## 2012-10-06 MED ORDER — POTASSIUM CHLORIDE CRYS ER 20 MEQ PO TBCR
40.0000 meq | EXTENDED_RELEASE_TABLET | Freq: Once | ORAL | Status: AC
  Administered 2012-10-06: 40 meq via ORAL
  Filled 2012-10-06: qty 2

## 2012-10-06 MED ORDER — SULFAMETHOXAZOLE-TMP DS 800-160 MG PO TABS
1.0000 | ORAL_TABLET | Freq: Once | ORAL | Status: AC
  Administered 2012-10-06: 1 via ORAL
  Filled 2012-10-06: qty 1

## 2012-10-06 NOTE — ED Notes (Signed)
EKG obtained in Triage, old one pulled from MUSE and handed both to Dr Hyacinth Meeker; he reviewed and signed it and I placed it on his desk.

## 2012-10-06 NOTE — ED Provider Notes (Signed)
History     CSN: [id number]  Arrival date & time 10/06/12  1515   First MD Initiated Contact with Patient 10/06/12 1731      Chief Complaint  Patient presents with  . Weakness  . Altered Mental Status    (Consider location/radiation/quality/duration/timing/severity/associated sxs/prior treatment) HPI The patient presents with her son who provides the history of present illness.  The patient has a history of dementia.  The patient's son notes that over the past 2 days the patient has had several episodes of weakness, as well as new bouts of decreased interactivity and facial contortion. The patient denies any complaints, denies any pain. This is a level V caveat secondary to dementia. Patient's son states that the patient was in her usual state of health prior to the onset of these episodes 2 days ago. Past Medical History  Diagnosis Date  . Hyperlipemia   . Hypothyroid   . Dementia 12/07/2011  . UTI (lower urinary tract infection) 12/07/2011    Past Surgical History  Procedure Laterality Date  . Abdominal surgery      No family history on file.  History  Substance Use Topics  . Smoking status: Never Smoker   . Smokeless tobacco: Not on file  . Alcohol Use: No    OB History   Grav Para Term Preterm Abortions TAB SAB Ect Mult Living                  Review of Systems  Unable to perform ROS: Dementia    Allergies  Review of patient's allergies indicates no known allergies.  Home Medications   Current Outpatient Rx  Name  Route  Sig  Dispense  Refill  . donepezil (ARICEPT) 5 MG tablet   Oral   Take 5 mg by mouth at bedtime.          . levETIRAcetam (KEPPRA) 250 MG tablet   Oral   Take 1 tablet (250 mg total) by mouth 2 (two) times daily.   60 tablet   5   . levothyroxine (SYNTHROID, LEVOTHROID) 100 MCG tablet   Oral   Take 100 mcg by mouth every morning.         . olmesartan (BENICAR) 40 MG tablet   Oral   Take 40 mg by mouth daily.          . sertraline (ZOLOFT) 50 MG tablet   Oral   Take 50 mg by mouth daily.          . simvastatin (ZOCOR) 40 MG tablet   Oral   Take 40 mg by mouth every evening.           BP 159/95  Pulse 69  Temp(Src) 97.4 F (36.3 C) (Oral)  Resp 16  Ht 5' (1.524 m)  Wt 85 lb (38.556 kg)  BMI 16.6 kg/m2  SpO2 95%  Physical Exam  Nursing note and vitals reviewed. Constitutional: She appears well-developed and well-nourished. No distress.  HENT:  Head: Normocephalic and atraumatic.  Edentulous, but otherwise no lesions.  Eyes: Conjunctivae and EOM are normal.  Cardiovascular: Normal rate and regular rhythm.   Pulmonary/Chest: Effort normal and breath sounds normal. No stridor. No respiratory distress.  Abdominal: She exhibits no distension.  Musculoskeletal: She exhibits no edema.  Neurological: She is alert. No cranial nerve deficit. She exhibits abnormal muscle tone.  Diffuse atrophy and chronic contraction of the medial 3 digits on the left hand.  These are both typical for the patient according  to her son.  The patient can move ultimately spontaneously, has otherwise appropriate neurologic exam. The patient is not oriented beyond self.  Skin: Skin is warm and dry.  Psychiatric: Cognition and memory are impaired. She exhibits abnormal recent memory and abnormal remote memory.    ED Course  Procedures (including critical care time)  Labs Reviewed  CBC WITH DIFFERENTIAL - Abnormal; Notable for the following:    RDW 15.7 (*)    Monocytes Relative 13 (*)    All other components within normal limits  BASIC METABOLIC PANEL - Abnormal; Notable for the following:    Potassium 2.8 (*)    Glucose, Bld 130 (*)    GFR calc non Af Amer 64 (*)    GFR calc Af Amer 74 (*)    All other components within normal limits  GLUCOSE, CAPILLARY - Abnormal; Notable for the following:    Glucose-Capillary 104 (*)    All other components within normal limits  URINALYSIS, ROUTINE W REFLEX MICROSCOPIC    Dg Chest 1 View  10/06/2012  *RADIOLOGY REPORT*  Clinical Data: Altered mental status.  CHEST - 1 VIEW  Comparison: 04/07/2012  Findings: Heart size and vascularity are normal and the lungs are clear.  No acute osseous abnormality.  Old compression fracture in the mid to lower thoracic spine.  Old left rib fractures.  IMPRESSION: No acute abnormality of the chest.   Original Report Authenticated By: Francene Boyers, M.D.    Ct Head Wo Contrast  10/06/2012  *RADIOLOGY REPORT*  Clinical Data: Altered level of consciousness.  Weakness.  Previous stroke.  CT HEAD WITHOUT CONTRAST  Technique:  Contiguous axial images were obtained from the base of the skull through the vertex without contrast.  Comparison: 04/08/2012  Findings: There is no evidence of intracranial hemorrhage, brain edema or other signs of acute infarction.  There is no evidence of intracranial mass lesion or mass effect.  No abnormal extra-axial fluid collections are identified.  Mild to moderate diffuse cerebral atrophy and extensive chronic small vessel disease is stable in appearance.  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  Ventricles are stable in size.  Old lacunar infarcts are again seen involving the basal ganglia and thalami bilaterally.  No skull abnormality identified. Chronic opacification of left frontal sinus again seen as well as mucosal thickening involving the left maxillary and ethmoid sinuses, without significant change.  IMPRESSION:  1.  No acute intracranial findings. 2.  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 3.  Stable cerebral atrophy and chronic small vessel disease. 4.  Chronic sinusitis, without significant change.   Original Report Authenticated By: Myles Rosenthal, M.D.      No diagnosis found.  Pulse ox 90% room air normal  6:28 PM Initial labs demonstrate hypokalemia. MDM  This elderly female with multiple medical problems, including dementia presents after episode of  atypical behavior, weakness.  On exam the patient is in no distress, and denies complaints.  However, the patient's history of dementia limits her utility as a historian.  On exam the patient has no new focal neurologic deficits, and although TIA is a possibility, additional considerations included metabolic or infectious causes.  The patient's evaluation is notable for demonstration of hypokalemia, urinary tract infection.  She received antibiotics, was discharged after potassium repletion.  Gerhard Munch, MD 10/06/12 [phone]

## 2012-10-06 NOTE — ED Notes (Signed)
Patient presents with family to ER with complaints of weakness and altered mental status. Family noticed yesterday that pt was unable to move her body off of the bed and could not hold herself up. This morning, patient was unable to ambulate and was altered when family was trying to dress patient. Patient's speech is slurred at baseline but family believes it to be worse today.

## 2012-10-06 NOTE — ED Notes (Signed)
CBG obtained in Triage...131.

## 2012-10-07 ENCOUNTER — Observation Stay (HOSPITAL_COMMUNITY)
Admission: EM | Admit: 2012-10-07 | Discharge: 2012-10-09 | Disposition: A | Discharge status: Home / Self Care | Payer: Medicare Other | Attending: Emergency Medicine | Admitting: Emergency Medicine

## 2012-10-07 ENCOUNTER — Encounter (HOSPITAL_COMMUNITY): Payer: Self-pay | Admitting: *Deleted

## 2012-10-07 DIAGNOSIS — G40309 Generalized idiopathic epilepsy and epileptic syndromes, not intractable, without status epilepticus: Principal | ICD-10-CM | POA: Insufficient documentation

## 2012-10-07 DIAGNOSIS — B961 Klebsiella pneumoniae [K. pneumoniae] as the cause of diseases classified elsewhere: Secondary | ICD-10-CM | POA: Insufficient documentation

## 2012-10-07 DIAGNOSIS — G319 Degenerative disease of nervous system, unspecified: Secondary | ICD-10-CM | POA: Insufficient documentation

## 2012-10-07 DIAGNOSIS — E876 Hypokalemia: Secondary | ICD-10-CM | POA: Insufficient documentation

## 2012-10-07 DIAGNOSIS — R569 Unspecified convulsions: Secondary | ICD-10-CM

## 2012-10-07 DIAGNOSIS — N390 Urinary tract infection, site not specified: Secondary | ICD-10-CM | POA: Insufficient documentation

## 2012-10-07 DIAGNOSIS — E039 Hypothyroidism, unspecified: Secondary | ICD-10-CM | POA: Insufficient documentation

## 2012-10-07 DIAGNOSIS — F0390 Unspecified dementia without behavioral disturbance: Secondary | ICD-10-CM | POA: Insufficient documentation

## 2012-10-07 LAB — CBC WITH DIFFERENTIAL/PLATELET
Basophils Relative: 0 % (ref 0–1)
Eosinophils Absolute: 0.1 10*3/uL (ref 0.0–0.7)
Eosinophils Relative: 1 % (ref 0–5)
Hemoglobin: 13 g/dL (ref 12.0–15.0)
MCH: 28.5 pg (ref 26.0–34.0)
MCHC: 31.9 g/dL (ref 30.0–36.0)
MCV: 89.3 fL (ref 78.0–100.0)
Monocytes Absolute: 1.1 10*3/uL — ABNORMAL HIGH (ref 0.1–1.0)
Monocytes Relative: 11 % (ref 3–12)
Neutrophils Relative %: 84 % — ABNORMAL HIGH (ref 43–77)

## 2012-10-07 LAB — BASIC METABOLIC PANEL
BUN: 17 mg/dL (ref 6–23)
CO2: 26 mEq/L (ref 19–32)
GFR calc non Af Amer: 62 mL/min — ABNORMAL LOW (ref >90)
Glucose, Bld: 148 mg/dL — ABNORMAL HIGH (ref 70–99)
Potassium: 4 mEq/L (ref 3.5–5.1)

## 2012-10-07 MED ORDER — SERTRALINE HCL 50 MG PO TABS
50.0000 mg | ORAL_TABLET | Freq: Every day | ORAL | Status: DC
  Administered 2012-10-07 – 2012-10-09 (×3): 50 mg via ORAL
  Filled 2012-10-07 (×5): qty 1

## 2012-10-07 MED ORDER — LEVETIRACETAM 500 MG PO TABS
500.0000 mg | ORAL_TABLET | Freq: Two times a day (BID) | ORAL | Status: DC
  Administered 2012-10-07 – 2012-10-09 (×5): 500 mg via ORAL
  Filled 2012-10-07 (×8): qty 1

## 2012-10-07 MED ORDER — LEVETIRACETAM 500 MG/5ML IV SOLN
INTRAVENOUS | Status: AC
  Filled 2012-10-07: qty 5

## 2012-10-07 MED ORDER — LEVOTHYROXINE SODIUM 100 MCG PO TABS
100.0000 ug | ORAL_TABLET | Freq: Every day | ORAL | Status: DC
  Administered 2012-10-07 – 2012-10-09 (×3): 100 ug via ORAL
  Filled 2012-10-07 (×5): qty 1

## 2012-10-07 MED ORDER — CIPROFLOXACIN IN D5W 400 MG/200ML IV SOLN
400.0000 mg | Freq: Two times a day (BID) | INTRAVENOUS | Status: DC
  Administered 2012-10-07 – 2012-10-09 (×5): 400 mg via INTRAVENOUS
  Filled 2012-10-07 (×7): qty 200

## 2012-10-07 MED ORDER — DONEPEZIL HCL 5 MG PO TABS
5.0000 mg | ORAL_TABLET | Freq: Every day | ORAL | Status: DC
  Administered 2012-10-07 – 2012-10-08 (×2): 5 mg via ORAL
  Filled 2012-10-07 (×3): qty 1

## 2012-10-07 MED ORDER — SIMVASTATIN 20 MG PO TABS
40.0000 mg | ORAL_TABLET | Freq: Every evening | ORAL | Status: DC
  Administered 2012-10-08: 40 mg via ORAL
  Filled 2012-10-07: qty 2
  Filled 2012-10-07: qty 1

## 2012-10-07 MED ORDER — IRBESARTAN 75 MG PO TABS
75.0000 mg | ORAL_TABLET | Freq: Every day | ORAL | Status: DC
  Administered 2012-10-07 – 2012-10-09 (×3): 75 mg via ORAL
  Filled 2012-10-07 (×5): qty 1

## 2012-10-07 MED ORDER — ENOXAPARIN SODIUM 30 MG/0.3ML ~~LOC~~ SOLN
40.0000 mg | SUBCUTANEOUS | Status: DC
  Administered 2012-10-07 – 2012-10-09 (×3): 40 mg via SUBCUTANEOUS
  Filled 2012-10-07 (×4): qty 0.3

## 2012-10-07 MED ORDER — SODIUM CHLORIDE 0.9 % IV SOLN
INTRAVENOUS | Status: AC
  Administered 2012-10-07: 13:00:00 via INTRAVENOUS

## 2012-10-07 MED ORDER — HYDROCODONE-ACETAMINOPHEN 5-325 MG PO TABS: 1.0000 | ORAL_TABLET | ORAL | Status: DC | PRN

## 2012-10-07 MED ORDER — SODIUM CHLORIDE 0.9 % IV SOLN
500.0000 mg | Freq: Once | INTRAVENOUS | Status: AC
  Administered 2012-10-07: 500 mg via INTRAVENOUS
  Filled 2012-10-07: qty 5

## 2012-10-07 MED ORDER — ONDANSETRON HCL 4 MG/2ML IJ SOLN
4.0000 mg | Freq: Four times a day (QID) | INTRAMUSCULAR | Status: DC | PRN
  Administered 2012-10-07: 4 mg via INTRAVENOUS
  Filled 2012-10-07: qty 2

## 2012-10-07 MED ORDER — SODIUM CHLORIDE 0.9 % IV SOLN
INTRAVENOUS | Status: DC
  Administered 2012-10-07 – 2012-10-08 (×2): via INTRAVENOUS

## 2012-10-07 NOTE — Consult Note (Signed)
[facility] Leonides Minder A. Gerilyn Pilgrim, MD     [email or web address]          [name] is an 72 y.o. female.   ASSESSMENT/PLAN: 1. Altered mental status due to postictal state from seizures.  2. The patient has had a breakthrough seizure and appropriately her medications have been interested. The dose has been increased to 5 mg twice a day. Additionally, she was given a bolus dose. I believe this is all appropriate. She is appears to be improving and therefore we will hold on repeating her EEG. We'll repeat her neurological evaluation in the morning time. The patient is 72 year old white female who presented with new-onset seizures several months ago. She was started on Keppra low dose and appears her done well until recently. She had a generalized tonoclonic seizure and was sent to the hospital. She was given Ativan and has been somewhat unresponsive afterwards. She does have a baseline history of cognitive impairment. 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 The patient is currently confused and therefore the review of systems is limited.  GENERAL: Thin lady who is in no acute distress.  HEENT: Supple. Atraumatic normocephalic.   ABDOMEN: soft  EXTREMITIES: No edema   BACK: Normal.  SKIN: Normal by inspection.    MENTAL STATUS: She is laying in bed with eyes closed but on arise to verbal command. She is not sure why she is in the hospital but does realizes she is in the hospital. She followed commands bilaterally. She does have some dysarthria with a suspected cervical baseline.  CRANIAL NERVES: Pupils are equal, round and reactive to light and accomodation; extra ocular movements are full, there is no significant nystagmus; upper and lower facial muscles are normal in strength and symmetric, there is no flattening of the nasolabial folds.  MOTOR: She appears to have a mild left hemiparesis. The left  upper extremity is about 4/5 although because of cognition the strength is actually limited. She does have significant left hand weakness with some evidence of spasticity involving the hand. The left lower extremity is about 4 minus. The right side is stronger and generally about 4+/5.  COORDINATION: Left finger to nose is normal, right finger to nose is normal, No rest tremor; no intention tremor; no postural tremor; no bradykinesia.  REFLEXES: Deep tendon reflexes are symmetrical and normal.    SENSATION: Unreliable.       Past Medical History  Diagnosis Date  . Hyperlipemia   . Hypothyroid   . Dementia 12/07/2011  . UTI (lower urinary tract infection) 12/07/2011    Past Surgical History  Procedure Laterality Date  . Abdominal surgery      History reviewed. No pertinent family history.  Social History:  reports that she has never smoked. She does not have any smokeless tobacco history on file. She reports that she does not drink alcohol or use illicit drugs.  Allergies: No Known Allergies  Medications: Prior to Admission medications   Medication Sig Start Date End Date Taking? Authorizing Provider  levothyroxine (SYNTHROID, LEVOTHROID) 75 MCG tablet Take 75 mcg by mouth daily.   Yes Historical Provider, MD  donepezil (ARICEPT) 5 MG tablet Take 5 mg by mouth at bedtime.     Historical Provider, MD  levETIRAcetam (KEPPRA) 250 MG tablet Take 1 tablet (250 mg total) by mouth 2 (two) times daily. 04/10/12   Angus Edilia Bo, MD  sertraline (ZOLOFT) 50 MG  tablet Take 50 mg by mouth daily.     Historical Provider, MD  simvastatin (ZOCOR) 40 MG tablet Take 40 mg by mouth every evening.    Historical Provider, MD   Scheduled Meds: . sodium chloride   Intravenous STAT  . ciprofloxacin  400 mg Intravenous Q12H  . donepezil  5 mg Oral QHS  . enoxaparin  40 mg Subcutaneous Q24H  . irbesartan  75 mg Oral Daily  . levETIRAcetam  500 mg Oral BID  . levothyroxine  100 mcg Oral QAC  breakfast  . sertraline  50 mg Oral Daily  . simvastatin  40 mg Oral QPM   Continuous Infusions: . sodium chloride 50 mL/hr at 10/07/12 0702   PRN Meds:.HYDROcodone-acetaminophen, ondansetron   Blood pressure 139/87, pulse 100, resp. rate 24, SpO2 100.00%.   Results for orders placed during the hospital encounter of 10/07/12 (from the past 48 hour(s))  BASIC METABOLIC PANEL     Status: Abnormal   Collection Time    10/07/12  5:00 AM      Result Value Range   Sodium 139  135 - 145 mEq/L   Potassium 4.0  3.5 - 5.1 mEq/L   Comment: DELTA CHECK NOTED   Chloride 100  96 - 112 mEq/L   CO2 26  19 - 32 mEq/L   Glucose, Bld 148 (*) 70 - 99 mg/dL   BUN 17  6 - 23 mg/dL   Creatinine, Ser 1.61  0.50 - 1.10 mg/dL   Calcium 8.9  8.4 - 09.6 mg/dL   GFR calc non Af Amer 62 (*) >90 mL/min   GFR calc Af Amer 72 (*) >90 mL/min   Comment:            The eGFR has been calculated     using the CKD EPI equation.     This calculation has not been     validated in all clinical     situations.     eGFR's persistently     <90 mL/min signify     possible Chronic Kidney Disease.  CBC WITH DIFFERENTIAL     Status: Abnormal   Collection Time    10/07/12  5:07 AM      Result Value Range   WBC 10.0  4.0 - 10.5 K/uL   RBC 4.56  3.87 - 5.11 MIL/uL   Hemoglobin 13.0  12.0 - 15.0 g/dL   HCT 04.5  40.9 - 81.1 %   MCV 89.3  78.0 - 100.0 fL   MCH 28.5  26.0 - 34.0 pg   MCHC 31.9  30.0 - 36.0 g/dL   RDW 91.4 (*) 78.2 - 95.6 %   Platelets 229  150 - 400 K/uL   Neutrophils Relative 84 (*) 43 - 77 %   Neutro Abs 8.4 (*) 1.7 - 7.7 K/uL   Lymphocytes Relative 4 (*) 12 - 46 %   Lymphs Abs 0.4 (*) 0.7 - 4.0 K/uL   Monocytes Relative 11  3 - 12 %   Monocytes Absolute 1.1 (*) 0.1 - 1.0 K/uL   Eosinophils Relative 1  0 - 5 %   Eosinophils Absolute 0.1  0.0 - 0.7 K/uL   Basophils Relative 0  0 - 1 %   Basophils Absolute 0.0  0.0 - 0.1 K/uL    Dg Chest 1 View  10/06/2012  *RADIOLOGY REPORT*  Clinical  Data: Altered mental status.  CHEST - 1 VIEW  Comparison: 04/07/2012  Findings: Heart size and vascularity  are normal and the lungs are clear.  No acute osseous abnormality.  Old compression fracture in the mid to lower thoracic spine.  Old left rib fractures.  IMPRESSION: No acute abnormality of the chest.   Original Report Authenticated By: Francene Boyers, M.D.    Ct Head Wo Contrast  10/06/2012  *RADIOLOGY REPORT*  Clinical Data: Altered level of consciousness.  Weakness.  Previous stroke.  CT HEAD WITHOUT CONTRAST  Technique:  Contiguous axial images were obtained from the base of the skull through the vertex without contrast.  Comparison: 04/08/2012  Findings: There is no evidence of intracranial hemorrhage, brain edema or other signs of acute infarction.  There is no evidence of intracranial mass lesion or mass effect.  No abnormal extra-axial fluid collections are identified.  Mild to moderate diffuse cerebral atrophy and extensive chronic small vessel disease is stable in appearance.  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  Ventricles are stable in size.  Old lacunar infarcts are again seen involving the basal ganglia and thalami bilaterally.  No skull abnormality identified. Chronic opacification of left frontal sinus again seen as well as mucosal thickening involving the left maxillary and ethmoid sinuses, without significant change.  IMPRESSION:  1.  No acute intracranial findings. 2.  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 3.  Stable cerebral atrophy and chronic small vessel disease. 4.  Chronic sinusitis, without significant change.   Original Report Authenticated By: Myles Rosenthal, M.D.         Akeel Reffner A. Gerilyn Pilgrim, M.D.  Diplomate, Biomedical engineer of Psychiatry and Neurology ( Neurology). 10/07/2012, 6:15 PM

## 2012-10-07 NOTE — ED Notes (Addendum)
Patient's son and caregiver Shaylyn Bawa) requesting that he be called later on in the morning with an update on his mother. Phone number: [phone]

## 2012-10-07 NOTE — ED Notes (Signed)
Attempted to give report. Stated unable to due to shift change but oncoming shift will call back.

## 2012-10-07 NOTE — ED Notes (Signed)
Pt had 2 witnessed seizures. Pt given 1 of ativan at 4am and another ativan at 402. Pt urinated on herself. Pt postictal.

## 2012-10-07 NOTE — ED Provider Notes (Signed)
History     CSN: [id number]  Arrival date & time 10/07/12  0418   None     Chief Complaint  Patient presents with  . Seizures    (Consider location/radiation/quality/duration/timing/severity/associated sxs/prior treatment) HPI Level 5 caveat due to unresponsiveness [name] IS A 72 y.o. female brought in by parents to the Emergency Department complaining of seizures. Patient had witnessed seizures.Given 1 mg ativan at 4 AM and another gram of ativan at 402 AM. Currently post ictal, sonorous respirations. EMS transported from home.. She is currently post ictal and unresponsive.  PCP Dr. Renard Matter   Past Medical History  Diagnosis Date  . Hyperlipemia   . Hypothyroid   . Dementia 12/07/2011  . UTI (lower urinary tract infection) 12/07/2011    Past Surgical History  Procedure Laterality Date  . Abdominal surgery      History reviewed. No pertinent family history.  History  Substance Use Topics  . Smoking status: Never Smoker   . Smokeless tobacco: Not on file  . Alcohol Use: No    OB History   Grav Para Term Preterm Abortions TAB SAB Ect Mult Living                  Review of Systems  Unable to perform ROS   Allergies  Review of patient's allergies indicates no known allergies.  Home Medications   Current Outpatient Rx  Name  Route  Sig  Dispense  Refill  . donepezil (ARICEPT) 5 MG tablet   Oral   Take 5 mg by mouth at bedtime.          . levETIRAcetam (KEPPRA) 250 MG tablet   Oral   Take 1 tablet (250 mg total) by mouth 2 (two) times daily.   60 tablet   5   . levothyroxine (SYNTHROID, LEVOTHROID) 100 MCG tablet   Oral   Take 100 mcg by mouth every morning.         . olmesartan (BENICAR) 40 MG tablet   Oral   Take 40 mg by mouth daily.         . sertraline (ZOLOFT) 50 MG tablet   Oral   Take 50 mg by mouth daily.          . simvastatin (ZOCOR) 40 MG tablet   Oral   Take 40 mg by mouth every evening.         .  sulfamethoxazole-trimethoprim (SEPTRA DS) 800-160 MG per tablet   Oral   Take 1 tablet by mouth 2 (two) times daily.   10 tablet   0     There were no vitals taken for this visit.  Physical Exam  Nursing note and vitals reviewed. Constitutional:  Elderly woman post ictal. Unresposiveness  HENT:  Head: Normocephalic and atraumatic.  Right Ear: External ear normal.  Mouth/Throat: Oropharynx is clear and moist.  edentulous  Eyes: Right eye exhibits no discharge. Left eye exhibits no discharge.  Neck: Normal range of motion. Neck supple.  Cardiovascular: Normal rate and intact distal pulses.   Pulmonary/Chest: Effort normal. She exhibits no tenderness.  Sonorous respirations.  Abdominal: Soft. Bowel sounds are normal. There is no tenderness. There is no rebound.  Musculoskeletal: She exhibits no tenderness.  Baseline ROM, no obvious new focal weakness.  Neurological:  Mental status and motor strength appears baseline for patient and situation.  Skin: No rash noted.  Psychiatric: She has a normal mood and affect.    ED Course  Procedures (  including critical care time) Results for orders placed during the hospital encounter of 10/07/12  BASIC METABOLIC PANEL      Result Value Range   Sodium 139  135 - 145 mEq/L   Potassium 4.0  3.5 - 5.1 mEq/L   Chloride 100  96 - 112 mEq/L   CO2 26  19 - 32 mEq/L   Glucose, Bld 148 (*) 70 - 99 mg/dL   BUN 17  6 - 23 mg/dL   Creatinine, Ser 3.08  0.50 - 1.10 mg/dL   Calcium 8.9  8.4 - 65.7 mg/dL   GFR calc non Af Amer 62 (*) >90 mL/min   GFR calc Af Amer 72 (*) >90 mL/min  CBC WITH DIFFERENTIAL      Result Value Range   WBC 10.0  4.0 - 10.5 K/uL   RBC 4.56  3.87 - 5.11 MIL/uL   Hemoglobin 13.0  12.0 - 15.0 g/dL   HCT 84.6  96.2 - 95.2 %   MCV 89.3  78.0 - 100.0 fL   MCH 28.5  26.0 - 34.0 pg   MCHC 31.9  30.0 - 36.0 g/dL   RDW 84.1 (*) 32.4 - 40.1 %   Platelets 229  150 - 400 K/uL   Neutrophils Relative 84 (*) 43 - 77 %   Neutro  Abs 8.4 (*) 1.7 - 7.7 K/uL   Lymphocytes Relative 4 (*) 12 - 46 %   Lymphs Abs 0.4 (*) 0.7 - 4.0 K/uL   Monocytes Relative 11  3 - 12 %   Monocytes Absolute 1.1 (*) 0.1 - 1.0 K/uL   Eosinophils Relative 1  0 - 5 %   Eosinophils Absolute 0.1  0.0 - 0.7 K/uL   Basophils Relative 0  0 - 1 %   Basophils Absolute 0.0  0.0 - 0.1 K/uL    Date: 10/07/2012   0424  Rate: 110  Rhythm: sinus tachycardia  QRS Axis: normal  Intervals: normal  ST/T Wave abnormalities: non specific ST and T wave abnormalities  Conduction Disutrbances: none  Narrative Interpretation: unremarkable   5:32 AM:  T/C to hospitalist, case discussed, including:  HPI, pertinent PM/SHx, VS/PE, dx testing, ED course and treatment.  With no labs drawn he requested we contact Dr. Renard Matter. 6:10 AM:  T/C to Dr. Renard Matter, case discussed, including:  HPI, pertinent PM/SHx, VS/PE, dx testing, ED course and treatment.  Agreeable to admission  Requests to write temporary orders, med surg bed .  MDM  Patient to the ER s/p seizures x 2, post ictal. Labs are normal. Ekg unremarkable. Will arrange to have her admitted. Administered keppra IV. Spoke with Dr. Renard Matter who will admit to med surg bed.Pt stable in ED with no significant deterioration in condition.The patient appears reasonably stabilized for admission considering the current resources, flow, and capabilities available in the ED at this time, and I doubt any other [facility] requiring further screening and/or treatment in the ED prior to admission.  MDM Reviewed: nursing note and vitals Interpretation: labs           Nicoletta Dress. Colon Branch, MD 10/07/12 [phone]

## 2012-10-08 LAB — URINE CULTURE

## 2012-10-08 MED ORDER — ENSURE PUDDING PO PUDG
1.0000 | Freq: Three times a day (TID) | ORAL | Status: DC
  Administered 2012-10-08 – 2012-10-09 (×4): 1 via ORAL

## 2012-10-08 MED ORDER — PRO-STAT SUGAR FREE PO LIQD
30.0000 mL | Freq: Three times a day (TID) | ORAL | Status: DC
Start: 2012-10-08 — End: 2012-10-09
  Administered 2012-10-08 – 2012-10-09 (×4): 30 mL via ORAL
  Filled 2012-10-08 (×4): qty 30

## 2012-10-08 NOTE — Progress Notes (Signed)
UR Chart Review Completed  

## 2012-10-08 NOTE — Progress Notes (Signed)
INITIAL NUTRITION ASSESSMENT  DOCUMENTATION CODES Per approved criteria  -Non-severe (moderate) malnutrition in the context of chronic illness -Underweight   INTERVENTION: Ensure Pudding (vanilla) po BID, each supplement provides 170 kcal and 4 grams of protein.  ProStat 30 ml BID  NUTRITION DIAGNOSIS: Malnutrition related to inadequate oral intake as evidenced by unplanned wt loss of 8% in 6 months and muscle and fat loss.   Goal: Pt to meet >/= 90% of their estimated nutrition needs  Monitor:  Meals, supplement intake, wt trends and labs  Reason for Assessment: Malnutrition Screen  72 y.o. female  Admitting Dx: s/p seizures x 2   ASSESSMENT: Pt has had significant 7#, 8% wt loss since last assessed in October 2013. Pt meets criteria for moderate malnutrition in the context of chronic illness given her unplanned wt loss 8% in 6 months and moderate depletion of muscle mass and body fat.  Nutrition Focused Physical Exam:  Subcutaneous Fat:  Orbital Region: moderate Upper Arm Region: severe malnutrition Thoracic and Lumbar Region: moderate  Muscle:  Temple Region: moderate Clavicle Bone Region: moderate Clavicle and Acromion Bone Region: moderate Scapular Bone Region: n/a Dorsal Hand: moderate Patellar Region: severe Anterior Thigh Region: n/a Posterior Calf Region: n/a  Edema: none noted   Height: Ht Readings from Last 1 Encounters:  10/08/12 5' (1.524 m)    Weight: Wt Readings from Last 1 Encounters:  10/08/12 85 lb 15.7 oz (39 kg)    Ideal Body Weight:   % Ideal Body Weight:  Wt Readings from Last 10 Encounters:  10/08/12 85 lb 15.7 oz (39 kg)  10/06/12 85 lb (38.556 kg)  05/10/12 93 lb 0.6 oz (42.2 kg)  04/09/12 89 lb 11.6 oz (40.7 kg)  12/10/11 90 lb 6.4 oz (41.005 kg)    Usual Body Weight: 93#   % Usual Body Weight: 92%  BMI:  Body mass index is 16.79 kg/(m^2). Underweight  Estimated Nutritional Needs: Kcal: 1200-1500 Protein: 50-60  gr Fluid: >1500 ml/day  Skin: no issues noted  Diet Order: General  EDUCATION NEEDS: -Education needs addressed   Intake/Output Summary (Last 24 hours) at 10/08/12 1036 Last data filed at 10/07/12 1700  Gross per 24 hour  Intake    240 ml  Output      0 ml  Net    240 ml    Last BM: 10/07/12  Labs:   Recent Labs Lab 10/06/12 1605 10/07/12 0500  NA 135 139  K 2.8* 4.0  CL 97 100  CO2 26 26  BUN 19 17  CREATININE 0.88 0.90  CALCIUM 10.0 8.9  GLUCOSE 130* 148*    CBG (last 3)   Recent Labs  10/06/12 1527 10/06/12 1749  GLUCAP 131* 104*    Scheduled Meds: . ciprofloxacin  400 mg Intravenous Q12H  . donepezil  5 mg Oral QHS  . enoxaparin  40 mg Subcutaneous Q24H  . irbesartan  75 mg Oral Daily  . levETIRAcetam  500 mg Oral BID  . levothyroxine  100 mcg Oral QAC breakfast  . sertraline  50 mg Oral Daily  . simvastatin  40 mg Oral QPM    Continuous Infusions: . sodium chloride 50 mL/hr at 10/07/12 8101    Past Medical History  Diagnosis Date  . Hyperlipemia   . Hypothyroid   . Dementia 12/07/2011  . UTI (lower urinary tract infection) 12/07/2011    Past Surgical History  Procedure Laterality Date  . Abdominal surgery      Royann Shivers  MS,RD,LDN,CSG Office: [phone] Pager: [phone]

## 2012-10-08 NOTE — Progress Notes (Signed)
Patient ID: [name], female   DOB: Dec 09, 1940, 72 y.o.   MRN: [id number]  [name] NEUROLOGY [name] A. Gerilyn Pilgrim, MD     [email or web address]          [name] is an 72 y.o. female.   Assessment/Plan: Breakthrough seizures with postictal confusion and lethargy. She has improved. She has tolerated the increased dose of Keppra. We'll continue with the 500 mg twice a day.  The patient has done well and generally has improved. She is sleeping but easily arousable to light sternal rub. She knows that she is in the hospital. She follows commands well and speaks in simple sentences. She is antigravity strength throughout although she continues to have mild left sided weakness at baseline.    Objective: Vital signs in last 24 hours: Temp:  [97.7 F (36.5 C)-98 F (36.7 C)] 97.7 F (36.5 C) (03/18 1500) Pulse Rate:  [72-88] 88 (03/18 1500) Resp:  [19-20] 20 (03/18 1500) BP: (115-136)/(69-81) 115/69 mmHg (03/18 1500) SpO2:  [95 %-99 %] 99 % (03/18 1500) Weight:  [39 kg (85 lb 15.7 oz)] 39 kg (85 lb 15.7 oz) (03/18 0837)  Intake/Output from previous day: 03/17 0701 - 03/18 0700 In: 240 [P.O.:240] Out: -  Intake/Output this shift:   Nutritional status: General   Lab Results: Results for orders placed during the hospital encounter of 10/07/12 (from the past 48 hour(s))  BASIC METABOLIC PANEL     Status: Abnormal   Collection Time    10/07/12  5:00 AM      Result Value Range   Sodium 139  135 - 145 mEq/L   Potassium 4.0  3.5 - 5.1 mEq/L   Comment: DELTA CHECK NOTED   Chloride 100  96 - 112 mEq/L   CO2 26  19 - 32 mEq/L   Glucose, Bld 148 (*) 70 - 99 mg/dL   BUN 17  6 - 23 mg/dL   Creatinine, Ser 4.09  0.50 - 1.10 mg/dL   Calcium 8.9  8.4 - 81.1 mg/dL   GFR calc non Af Amer 62 (*) >90 mL/min   GFR calc Af Amer 72 (*) >90 mL/min   Comment:            The eGFR has been calculated     using the CKD EPI equation.     This calculation has not been     validated in all  clinical     situations.     eGFR's persistently     <90 mL/min signify     possible Chronic Kidney Disease.  CBC WITH DIFFERENTIAL     Status: Abnormal   Collection Time    10/07/12  5:07 AM      Result Value Range   WBC 10.0  4.0 - 10.5 K/uL   RBC 4.56  3.87 - 5.11 MIL/uL   Hemoglobin 13.0  12.0 - 15.0 g/dL   HCT 91.4  78.2 - 95.6 %   MCV 89.3  78.0 - 100.0 fL   MCH 28.5  26.0 - 34.0 pg   MCHC 31.9  30.0 - 36.0 g/dL   RDW 21.3 (*) 08.6 - 57.8 %   Platelets 229  150 - 400 K/uL   Neutrophils Relative 84 (*) 43 - 77 %   Neutro Abs 8.4 (*) 1.7 - 7.7 K/uL   Lymphocytes Relative 4 (*) 12 - 46 %   Lymphs Abs 0.4 (*) 0.7 - 4.0 K/uL   Monocytes Relative 11  3 -  12 %   Monocytes Absolute 1.1 (*) 0.1 - 1.0 K/uL   Eosinophils Relative 1  0 - 5 %   Eosinophils Absolute 0.1  0.0 - 0.7 K/uL   Basophils Relative 0  0 - 1 %   Basophils Absolute 0.0  0.0 - 0.1 K/uL    Lipid Panel No results found for this basename: CHOL, TRIG, HDL, CHOLHDL, VLDL, LDLCALC,  in the last 72 hours  Studies/Results: No results found.  Medications:  Scheduled Meds: . ciprofloxacin  400 mg Intravenous Q12H  . donepezil  5 mg Oral QHS  . enoxaparin  40 mg Subcutaneous Q24H  . feeding supplement  1 Container Oral TID BM  . feeding supplement  30 mL Oral TID WC  . irbesartan  75 mg Oral Daily  . levETIRAcetam  500 mg Oral BID  . levothyroxine  100 mcg Oral QAC breakfast  . sertraline  50 mg Oral Daily  . simvastatin  40 mg Oral QPM   Continuous Infusions: . sodium chloride 50 mL/hr at 10/07/12 0702   PRN Meds:.HYDROcodone-acetaminophen, ondansetron    LOS: 1 day   Drinda Belgard A. Gerilyn Pilgrim, M.D.  Diplomate, Biomedical engineer of Psychiatry and Neurology ( Neurology).

## 2012-10-08 NOTE — Progress Notes (Signed)
Subjective: This patient is alert and oriented this morning. She was admitted following a seizure had become drowsy and unresponsive was found to have hypokinemia and urinary tract infection. She's had no further seizures.  Objective: Vital signs in last 24 hours: Temp:  [97.9 F (36.6 C)-98 F (36.7 C)] 97.9 F (36.6 C) (03/18 0630) Pulse Rate:  [72-100] 72 (03/18 0630) Resp:  [19-22] 19 (03/18 0630) BP: (126-136)/(74-81) 136/81 mmHg (03/18 0630) SpO2:  [95 %-100 %] 98 % (03/18 0630) Weight change:  Last BM Date: 10/07/12  Intake/Output from previous day: 03/17 0701 - 03/18 0700 In: 240 [P.O.:240] Out: -  Intake/Output this shift:    Physical Exam: The patient is alert and oriented this morning  Vital signs blood pressure 136/81 respiration 19 pulse 72 temp 97.9  HEENT eyes PERRLA TM negative oropharynx benign neck supple no JVD or thyroid abnormalities  Heart regular rhythm no murmurs  Lungs clear to P&A  Abdomen the palpable organs or masses  Neurological cranial nerves intact no motor or Sentry abnormalities   Recent Labs  10/06/12 1605 10/07/12 0507  WBC 7.8 10.0  HGB 12.8 13.0  HCT 39.8 40.7  PLT 288 229   BMET  Recent Labs  10/06/12 1605 10/07/12 0500  NA 135 139  K 2.8* 4.0  CL 97 100  CO2 26 26  GLUCOSE 130* 148*  BUN 19 17  CREATININE 0.88 0.90  CALCIUM 10.0 8.9    Studies/Results: Dg Chest 1 View  10/06/2012  *RADIOLOGY REPORT*  Clinical Data: Altered mental status.  CHEST - 1 VIEW  Comparison: 04/07/2012  Findings: Heart size and vascularity are normal and the lungs are clear.  No acute osseous abnormality.  Old compression fracture in the mid to lower thoracic spine.  Old left rib fractures.  IMPRESSION: No acute abnormality of the chest.   Original Report Authenticated By: Francene Boyers, M.D.    Ct Head Wo Contrast  10/06/2012  *RADIOLOGY REPORT*  Clinical Data: Altered level of consciousness.  Weakness.  Previous stroke.  CT HEAD  WITHOUT CONTRAST  Technique:  Contiguous axial images were obtained from the base of the skull through the vertex without contrast.  Comparison: 04/08/2012  Findings: There is no evidence of intracranial hemorrhage, brain edema or other signs of acute infarction.  There is no evidence of intracranial mass lesion or mass effect.  No abnormal extra-axial fluid collections are identified.  Mild to moderate diffuse cerebral atrophy and extensive chronic small vessel disease is stable in appearance.  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  Ventricles are stable in size.  Old lacunar infarcts are again seen involving the basal ganglia and thalami bilaterally.  No skull abnormality identified. Chronic opacification of left frontal sinus again seen as well as mucosal thickening involving the left maxillary and ethmoid sinuses, without significant change.  IMPRESSION:  1.  No acute intracranial findings. 2.  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 3.  Stable cerebral atrophy and chronic small vessel disease. 4.  Chronic sinusitis, without significant change.   Original Report Authenticated By: Myles Rosenthal, M.D.     Medications:  . ciprofloxacin  400 mg Intravenous Q12H  . donepezil  5 mg Oral QHS  . enoxaparin  40 mg Subcutaneous Q24H  . irbesartan  75 mg Oral Daily  . levETIRAcetam  500 mg Oral BID  . levothyroxine  100 mcg Oral QAC breakfast  . sertraline  50 mg Oral Daily  . simvastatin  40 mg Oral QPM    . sodium chloride 50 mL/hr at 10/07/12 0702     Assessment/Plan: 1. The patient was admitted following seizures plan to continue Plan to continue Keppra  Patient does have a history of hypothyroidism and previous urinary tract infection. We'll obtain neurology consult.  LOS: 1 day   Guynell Kleiber G 10/08/2012, 7:19 AM

## 2012-10-08 NOTE — H&P (Signed)
NAME:  [name], [name]                  ACCOUNT NO.:  [id number]  MEDICAL RECORD NO.:  [id number]  LOCATION:  A313                          FACILITY:  [facility]  PHYSICIAN:  Camdon Saetern G. Renard Matter, MD   DATE OF BIRTH:  1940-11-10  DATE OF ADMISSION:  10/07/2012 DATE OF DISCHARGE:  LH                             HISTORY & PHYSICAL   HISTORY OF PRESENT ILLNESS:  This 72 year old white female who came into the emergency room earlier today having experienced a seizure.  She was seen by ED physician and given 1 mg of Ativan at 4:00 a.m. Following this she was drowsy and unresponsive and could not return home.  She had been seen previously in the ED with episodes of weakness.  She was noted to have hypokalemia, urinary tract infection.  She received antibiotics and was discharged after potassium repletion.  On this occasion, she was admitted to a med surg bed for further care.  SOCIAL HISTORY:  The patient does not smoke or drink alcohol.  PAST MEDICAL HISTORY:  The patient has history of: 1. Hypothyroidism. 2. Dementia. 3. Previous urinary tract infections. 4. Hyperlipidemia.  PAST SURGICAL HISTORY:  Prior abdominal surgery, but the patient does not know what the surgery was.  ALLERGIES:  No known allergies for 2 days.  REVIEW OF SYSTEMS:  CARDIOPULMONARY:  No cough, hemoptysis, or dyspnea. GI:  No bowel irregularity or bleeding.  GU:  No dysuria, hematuria.  PHYSICAL EXAMINATION:  GENERAL:  Lethargic female. VITAL SIGNS:  Blood pressure 139/87, respirations 24, pulse 100, temp 97.4. HEENT:  Eyes, PERRLA.  TMs negative.  Oropharynx benign. NECK:  Supple.  No JVD or thyroid abnormalities. HEART:  Regular rhythm.  No murmurs. LUNGS:  Clear to P and A. ABDOMEN:  No palpable organs or masses.  No tenderness. SKIN:  No irritant dermatitis. NEUROLOGICAL:  Cranial nerves intact.  No motor or sensory abnormalities.  ASSESSMENT:  The patient was admitted following her seizures.  She does have  prior history of hypothyroidism, previous urinary tract infections.  MEDICATIONS: 1. Aricept 5 mg daily. 2. Keppra 250 mg b.i.d. 3. Synthroid 100 mcg daily. 4. Benicar 40 mg daily. 5. Zoloft 50 mg daily. 6. Simvastatin 40 mg daily. 7. Septra DS b.i.d.  PLAN:  To continue antibiotics for UTI.  We will up the dose of Keppra to 500 mg b.i.d. and obtain Neurology consult.  Continue current medications.     Yavuz Kirby G. Renard Matter, MD     AGM/MEDQ  D:  10/07/2012  T:  10/08/2012  Job:  [id number]

## 2012-10-09 LAB — URINALYSIS, ROUTINE W REFLEX MICROSCOPIC
Leukocytes, UA: NEGATIVE
Nitrite: NEGATIVE
Protein, ur: 30 mg/dL — AB
Specific Gravity, Urine: 1.015 (ref 1.005–1.030)
Urobilinogen, UA: 1 mg/dL (ref 0.0–1.0)

## 2012-10-09 LAB — URINE MICROSCOPIC-ADD ON

## 2012-10-09 MED ORDER — IRBESARTAN 75 MG PO TABS: 75.0000 mg | ORAL_TABLET | Freq: Every day | ORAL | Status: AC

## 2012-10-09 MED ORDER — LEVETIRACETAM 500 MG PO TABS: 500.0000 mg | ORAL_TABLET | Freq: Two times a day (BID) | ORAL | Status: AC

## 2012-10-09 MED ORDER — LEVOTHYROXINE SODIUM 100 MCG PO TABS: 100.0000 ug | ORAL_TABLET | Freq: Every day | ORAL | Status: AC

## 2012-10-09 NOTE — Discharge Summary (Signed)
NAME:  [name], [name]                  ACCOUNT NO.:  [id number]  MEDICAL RECORD NO.:  [id number]  LOCATION:  A313                          FACILITY:  [facility]  PHYSICIAN:  Jolee Critcher G. Renard Matter, MD   DATE OF BIRTH:  02/20/1941  DATE OF ADMISSION:  10/07/2012 DATE OF DISCHARGE:  03/19/2014LH                              DISCHARGE SUMMARY   ADDENDUM:  The patient will continue the following medications at home: 1. Avapro 75 mg daily. 2. Keppra 500 mg b.i.d. 3. Synthroid 100 mcg daily. 4. Aricept 5 mg daily. 5. Zoloft 50 mg daily. 6. Simvastatin 40 mg daily.     Kharma Sampsel G. Renard Matter, MD     AGM/MEDQ  D:  10/09/2012  T:  10/09/2012  Job:  [id number]

## 2012-10-09 NOTE — Progress Notes (Signed)
UR Chart Review Completed  

## 2012-10-09 NOTE — Care Management Note (Signed)
    Page 1 of 1   10/09/2012     1:51:04 PM   CARE MANAGEMENT NOTE 10/09/2012  Patient:  [name], [name]   Account Number:  [id number]  Date Initiated:  10/09/2012  Documentation initiated by:  Sharrie Rothman  Subjective/Objective Assessment:   Pt admitted from home with seizure and UTI. Pt lives with her son and he provides 24 hour care for the pt. Pt has a walker for home use.     Action/Plan:   Pt to be discharged home today. Pts son denies any need for [facility] at discharge.   Anticipated DC Date:  10/09/2012   Anticipated DC Plan:  HOME/SELF CARE      DC Planning Services  CM consult      Choice offered to / List presented to:             Status of service:  Completed, signed off Medicare Important Message given?   (If response is "NO", the following Medicare IM given date fields will be blank) Date Medicare IM given:   Date Additional Medicare IM given:    Discharge Disposition:  HOME/SELF CARE  Per UR Regulation:    If discussed at Long Length of Stay Meetings, dates discussed:    Comments:  10/09/12 1115 Arlyss Queen, RN BSN CM

## 2012-10-09 NOTE — Progress Notes (Signed)
Subjective: The patient is alert and oriented this morning his had no further seizures but did have low potassium level. And is treated for urinary tract infection  Objective: Vital signs in last 24 hours: Temp:  [97.7 F (36.5 C)-98.1 F (36.7 C)] 98.1 F (36.7 C) (03/18 2142) Pulse Rate:  [72-88] 80 (03/18 2142) Resp:  [19-20] 20 (03/18 2142) BP: (115-137)/(69-82) 137/82 mmHg (03/18 2142) SpO2:  [93 %-99 %] 93 % (03/18 2142) Weight:  [39 kg (85 lb 15.7 oz)] 39 kg (85 lb 15.7 oz) (03/18 0837) Weight change:  Last BM Date: 10/07/12  Intake/Output from previous day: 03/18 0701 - 03/19 0700 In: 2678.3 [P.O.:830; I.V.:1648.3; IV Piggyback:200] Out: -  Intake/Output this shift:    Physical Exam: The patient is alert and oriented this morning  Blood pressure 137/82 respiration 20 pulse 80 temp 98.1  HEENT eyes PERRLA TM negative oropharynx benign neck supple no JVD or thyroid abnormalities  Lungs clear to P&A  Heart regular rhythm no murmurs  Abdomen no palpable organs or masses  Neurological no motor or sensory abnormalities   Recent Labs  10/06/12 1605 10/07/12 0507  WBC 7.8 10.0  HGB 12.8 13.0  HCT 39.8 40.7  PLT 288 229   BMET  Recent Labs  10/06/12 1605 10/07/12 0500  NA 135 139  K 2.8* 4.0  CL 97 100  CO2 26 26  GLUCOSE 130* 148*  BUN 19 17  CREATININE 0.88 0.90  CALCIUM 10.0 8.9    Studies/Results: No results found.  Medications:  . ciprofloxacin  400 mg Intravenous Q12H  . donepezil  5 mg Oral QHS  . enoxaparin  40 mg Subcutaneous Q24H  . feeding supplement  1 Container Oral TID BM  . feeding supplement  30 mL Oral TID WC  . irbesartan  75 mg Oral Daily  . levETIRAcetam  500 mg Oral BID  . levothyroxine  100 mcg Oral QAC breakfast  . sertraline  50 mg Oral Daily  . simvastatin  40 mg Oral QPM    . sodium chloride 50 mL/hr at 10/08/12 2122     Assessment/Plan: The patient was admitted following seizures she's had no further seizure  2 UTI 3 hypothyroidism plan to continue current regimen. Will repeat UA   LOS: 2 days   Proctor Carriker G 10/09/2012, 6:21 AM

## 2012-10-09 NOTE — Discharge Summary (Signed)
NAME:  [name], [name]                  ACCOUNT NO.:  [id number]  MEDICAL RECORD NO.:  [id number]  LOCATION:  A313                          FACILITY:  [facility]  PHYSICIAN:  Lillyn Wieczorek G. Renard Matter, MD   DATE OF BIRTH:  Apr 29, 1941  DATE OF ADMISSION:  10/07/2012 DATE OF DISCHARGE:  03/19/2014LH                              DISCHARGE SUMMARY   This is a 72 year old white female was admitted October 07, 2012 time of discharge, October 09, 2012, 2 days hospitalization.  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  CONDITION:  Stable and improved at the time of her discharge.  HISTORY OF PRESENT ILLNESS:  This 72 year old white female came into the emergency room earlier on the day of admission having experienced a seizure.  She was seen by ED physician at that time and given 1 mg of Ativan.  Following this, she was drowsy and unresponsive and could not return home.  She had been seen previously in the emergency department and noted to have hypokalemia and urinary tract infection.  She was therefore admitted.  PHYSICAL EXAMINATION:  GENERAL:  On admission, lethargic female, thought to be postictal. VITAL SIGNS:  Blood pressure 139/87, respiration 24, pulse 100, temp 97.4. HEENT:  Eyes, PERRLA.  TM negative.  Oropharynx benign. NECK:  Supple.  No JVD or thyroid abnormalities. HEART:  Regular rhythm.  No murmurs. LUNGS:  Clear to P and A. ABDOMEN:  No palpable organs or masses.  No tenderness. SKIN:  Warm and dry. NEUROLOGICAL:  Cranial nerves intact.  No motor or sensory abnormalities.  Reflexes equal.  LABORATORY DATA:  Admission CBC:  WBC is 7800 with hemoglobin of 12.8, hematocrit 39.8.  Chemistries on admission, sodium 139, potassium 4, chloride 100, CO2 26, BUN 17, creatinine 0.90, calcium 8.9.  Subsequent CBC:  WBC 10,000 with hemoglobin 13.0, hematocrit  40.7, platelets 229.  RADIOLOGY:  X-ray of the chest, no acute abnormality.  CT of the head, no acute intracranial findings, old left posterior parietal infarct and old lacunar infarcts, stable cerebral atrophy, chronic small vessel disease, chronic sinusitis without significant change.  Urinalysis increased number of wbc's.  Urine culture, Klebsiella.  HOSPITAL COURSE:  The patient, at the time of her admission, was placed on intravenous fluids, 0.9% saline.  Her vital signs were monitored, and she was continued on the following medications, IV Cipro 400 mg every 12 hours.  The subcutaneous Lovenox 40 mg daily for DVT prophylaxis, Aricept 5 mg daily, Ensure 1 container t.i.d., Avapro 75 mg daily, Keppra 500 mg b.i.d., Synthroid 100 mcg daily, Zoloft 50 mg daily, simvastatin 40 mg daily.  The patient had no further seizures following admission.  She gradually became more alert.  She was seen by Neurology, and he agreed with the current treatment.     Jameon Deller G. Renard Matter, MD     AGM/MEDQ  D:  10/09/2012  T:  10/09/2012  Job:  [id number]

## 2012-10-09 NOTE — Progress Notes (Signed)
Patient discharged home with children.  Patient asked me to go over discharge instructions with them and have daughter sign.  Instructed daughter and son on changes to patients meds.  Also instructed them to see Dr. Renard Matter in his office tomorrow and to bring her meds for him to review.  IV removed - WNL.  Dressed in own belongings.  Patient stable to discharge at this time.  Son or daughter have no questions at this time.  Verbalizes understanding of instructions.

## 2013-10-04 ENCOUNTER — Inpatient Hospital Stay (HOSPITAL_COMMUNITY)
Admission: EM | Admit: 2013-10-04 | Discharge: 2013-10-09 | DRG: 690 | Disposition: A | Discharge status: Skilled Nursing Facility | Payer: Medicare Other | Attending: Family Medicine | Admitting: Family Medicine

## 2013-10-04 ENCOUNTER — Emergency Department (HOSPITAL_COMMUNITY): Payer: Medicare Other

## 2013-10-04 ENCOUNTER — Encounter (HOSPITAL_COMMUNITY): Payer: Self-pay | Admitting: Emergency Medicine

## 2013-10-04 DIAGNOSIS — G40909 Epilepsy, unspecified, not intractable, without status epilepticus: Secondary | ICD-10-CM | POA: Diagnosis present

## 2013-10-04 DIAGNOSIS — E039 Hypothyroidism, unspecified: Secondary | ICD-10-CM | POA: Diagnosis present

## 2013-10-04 DIAGNOSIS — F0280 Dementia in other diseases classified elsewhere without behavioral disturbance: Secondary | ICD-10-CM | POA: Diagnosis present

## 2013-10-04 DIAGNOSIS — G309 Alzheimer's disease, unspecified: Secondary | ICD-10-CM | POA: Diagnosis present

## 2013-10-04 DIAGNOSIS — E785 Hyperlipidemia, unspecified: Secondary | ICD-10-CM | POA: Diagnosis present

## 2013-10-04 DIAGNOSIS — B961 Klebsiella pneumoniae [K. pneumoniae] as the cause of diseases classified elsewhere: Secondary | ICD-10-CM | POA: Diagnosis present

## 2013-10-04 DIAGNOSIS — I10 Essential (primary) hypertension: Secondary | ICD-10-CM | POA: Diagnosis present

## 2013-10-04 DIAGNOSIS — N390 Urinary tract infection, site not specified: Principal | ICD-10-CM | POA: Diagnosis present

## 2013-10-04 DIAGNOSIS — F0390 Unspecified dementia without behavioral disturbance: Secondary | ICD-10-CM

## 2013-10-04 LAB — URINALYSIS, ROUTINE W REFLEX MICROSCOPIC
BILIRUBIN URINE: NEGATIVE
GLUCOSE, UA: NEGATIVE mg/dL
HGB URINE DIPSTICK: NEGATIVE
Ketones, ur: NEGATIVE mg/dL
Leukocytes, UA: NEGATIVE
Nitrite: POSITIVE — AB
PH: 5.5 (ref 5.0–8.0)
Protein, ur: NEGATIVE mg/dL
SPECIFIC GRAVITY, URINE: 1.02 (ref 1.005–1.030)
UROBILINOGEN UA: 0.2 mg/dL (ref 0.0–1.0)

## 2013-10-04 LAB — PROTIME-INR
INR: 0.9 (ref 0.00–1.49)
PROTHROMBIN TIME: 12 s (ref 11.6–15.2)

## 2013-10-04 LAB — COMPREHENSIVE METABOLIC PANEL
ALK PHOS: 119 U/L — AB (ref 39–117)
ALT: 19 U/L (ref 0–35)
AST: 21 U/L (ref 0–37)
Albumin: 2.8 g/dL — ABNORMAL LOW (ref 3.5–5.2)
BILIRUBIN TOTAL: 0.3 mg/dL (ref 0.3–1.2)
BUN: 24 mg/dL — ABNORMAL HIGH (ref 6–23)
CHLORIDE: 101 meq/L (ref 96–112)
CO2: 25 meq/L (ref 19–32)
CREATININE: 1 mg/dL (ref 0.50–1.10)
Calcium: 9.5 mg/dL (ref 8.4–10.5)
GFR calc Af Amer: 63 mL/min — ABNORMAL LOW (ref >90)
GFR, EST NON AFRICAN AMERICAN: 55 mL/min — AB (ref >90)
Glucose, Bld: 125 mg/dL — ABNORMAL HIGH (ref 70–99)
POTASSIUM: 4.3 meq/L (ref 3.7–5.3)
Sodium: 137 mEq/L (ref 137–147)
Total Protein: 7.7 g/dL (ref 6.0–8.3)

## 2013-10-04 LAB — CBC WITH DIFFERENTIAL/PLATELET
BASOS ABS: 0 10*3/uL (ref 0.0–0.1)
Basophils Relative: 0 % (ref 0–1)
Eosinophils Absolute: 0.1 10*3/uL (ref 0.0–0.7)
Eosinophils Relative: 2 % (ref 0–5)
HEMATOCRIT: 35.6 % — AB (ref 36.0–46.0)
HEMOGLOBIN: 11.5 g/dL — AB (ref 12.0–15.0)
LYMPHS PCT: 8 % — AB (ref 12–46)
Lymphs Abs: 0.6 10*3/uL — ABNORMAL LOW (ref 0.7–4.0)
MCH: 29.8 pg (ref 26.0–34.0)
MCHC: 32.3 g/dL (ref 30.0–36.0)
MCV: 92.2 fL (ref 78.0–100.0)
MONO ABS: 0.8 10*3/uL (ref 0.1–1.0)
MONOS PCT: 10 % (ref 3–12)
NEUTROS ABS: 6.1 10*3/uL (ref 1.7–7.7)
NEUTROS PCT: 80 % — AB (ref 43–77)
Platelets: 252 10*3/uL (ref 150–400)
RBC: 3.86 MIL/uL — ABNORMAL LOW (ref 3.87–5.11)
RDW: 14.7 % (ref 11.5–15.5)
WBC: 7.6 10*3/uL (ref 4.0–10.5)

## 2013-10-04 LAB — URINE MICROSCOPIC-ADD ON

## 2013-10-04 LAB — TROPONIN I: Troponin I: <0.3 ng/mL (ref <0.30)

## 2013-10-04 MED ORDER — DONEPEZIL HCL 5 MG PO TABS
5.0000 mg | ORAL_TABLET | Freq: Every day | ORAL | Status: DC
  Administered 2013-10-05 – 2013-10-08 (×5): 5 mg via ORAL
  Filled 2013-10-04 (×5): qty 1

## 2013-10-04 MED ORDER — LEVOTHYROXINE SODIUM 100 MCG PO TABS
100.0000 ug | ORAL_TABLET | Freq: Every day | ORAL | Status: DC
  Administered 2013-10-05 – 2013-10-09 (×5): 100 ug via ORAL
  Filled 2013-10-04 (×3): qty 1
  Filled 2013-10-04: qty 2
  Filled 2013-10-04: qty 1

## 2013-10-04 MED ORDER — DEXTROSE 5 % IV SOLN
1.0000 g | INTRAVENOUS | Status: DC
  Administered 2013-10-05 – 2013-10-08 (×4): 1 g via INTRAVENOUS
  Filled 2013-10-04 (×5): qty 10

## 2013-10-04 MED ORDER — ONDANSETRON HCL 4 MG PO TABS: 4.0000 mg | ORAL_TABLET | Freq: Four times a day (QID) | ORAL | Status: DC | PRN

## 2013-10-04 MED ORDER — HYDROCODONE-ACETAMINOPHEN 5-325 MG PO TABS: 1.0000 | ORAL_TABLET | ORAL | Status: DC | PRN

## 2013-10-04 MED ORDER — ONDANSETRON HCL 4 MG/2ML IJ SOLN: 4.0000 mg | Freq: Four times a day (QID) | INTRAMUSCULAR | Status: DC | PRN

## 2013-10-04 MED ORDER — ENOXAPARIN SODIUM 40 MG/0.4ML ~~LOC~~ SOLN
40.0000 mg | SUBCUTANEOUS | Status: DC
  Administered 2013-10-05 – 2013-10-07 (×3): 40 mg via SUBCUTANEOUS
  Filled 2013-10-04 (×3): qty 0.4

## 2013-10-04 MED ORDER — CEFTRIAXONE SODIUM 1 G IJ SOLR
1.0000 g | Freq: Once | INTRAMUSCULAR | Status: AC
  Administered 2013-10-04: 1 g via INTRAVENOUS
  Filled 2013-10-04: qty 10

## 2013-10-04 MED ORDER — SIMVASTATIN 20 MG PO TABS
40.0000 mg | ORAL_TABLET | Freq: Every evening | ORAL | Status: DC
  Administered 2013-10-05 – 2013-10-08 (×5): 40 mg via ORAL
  Filled 2013-10-04 (×5): qty 2

## 2013-10-04 MED ORDER — SODIUM CHLORIDE 0.9 % IV SOLN
INTRAVENOUS | Status: DC
  Administered 2013-10-05 – 2013-10-08 (×7): via INTRAVENOUS

## 2013-10-04 MED ORDER — LEVETIRACETAM 500 MG PO TABS
500.0000 mg | ORAL_TABLET | Freq: Two times a day (BID) | ORAL | Status: DC
  Administered 2013-10-05 – 2013-10-09 (×10): 500 mg via ORAL
  Filled 2013-10-04 (×10): qty 1

## 2013-10-04 MED ORDER — SODIUM CHLORIDE 0.9 % IV SOLN
INTRAVENOUS | Status: AC
  Administered 2013-10-04: 21:00:00 via INTRAVENOUS

## 2013-10-04 MED ORDER — IRBESARTAN 75 MG PO TABS
75.0000 mg | ORAL_TABLET | Freq: Every day | ORAL | Status: DC
  Administered 2013-10-05 – 2013-10-09 (×5): 75 mg via ORAL
  Filled 2013-10-04 (×5): qty 1

## 2013-10-04 MED ORDER — SERTRALINE HCL 50 MG PO TABS
50.0000 mg | ORAL_TABLET | Freq: Every day | ORAL | Status: DC
Start: 2013-10-05 — End: 2013-10-09
  Administered 2013-10-05 – 2013-10-09 (×5): 50 mg via ORAL
  Filled 2013-10-04 (×5): qty 1

## 2013-10-04 NOTE — ED Notes (Signed)
Pt son at bedside states that pt has been getting weaker over the past few days, rolled off the bed this am, denies any injury from fall, states that he picked pt up and helped her to the kitchen, has been incontinent of stool 'due to dementia" son states that pt's last bowel movement was 15-20 minutes prior to arrival.

## 2013-10-04 NOTE — ED Notes (Addendum)
Pt arrived to er by [facility] for further evaluation of "sick call", ems states that they were dispatched to pt residence for check up/sick call. Upon arrival to residence, ems was met at door by pt's son who advised them to excuse the mess. EMS reports that house had dirty dishes scattered throughout, had multiple sigtings while they were there of roach's, clothes stacked halfway up the wall in the floor and strong odor, upon arrival to er, pt able to state her name, birthday, address, son's name, denies any complaints, pt has strong odor of stool noted once placed in tx room, pt had socks on with dried stool, had dried stool that would flake off when removing her pants and depends noted to buttocks and upper legs, hair is disheveled. Pt changed into hospital gown, cleaned, warm blankets provided, pt does state that she will roll out of her bed sometimes and will "pass out" sometimes,  CBG with ems 160

## 2013-10-04 NOTE — ED Provider Notes (Signed)
CSN: [id number]     Arrival date & time 10/04/13  1814 History   First MD Initiated Contact with Patient 10/04/13 1844     Chief Complaint  Patient presents with  . Weakness     (Consider location/radiation/quality/duration/timing/severity/associated sxs/prior Treatment) HPI Comments: Patient's son called EMS for "sick person". EMS found patient to be disheveled in the house in disarray. She was covered in feces. Patient is alert and oriented to person and place. She is not oriented to time. She denies any pain complaints. She states she came to the hospital because she "passes out sometimes rolls out of bed". He denies any headache, chest pain, abdominal pain or back pain. No fevers or vomiting. Good by mouth intake and urine output.  See nursing note for further details of home situation.  The history is provided by the patient and the EMS personnel. The history is limited by the condition of the patient.    Past Medical History  Diagnosis Date  . Hyperlipemia   . Hypothyroid   . Dementia 12/07/2011  . UTI (lower urinary tract infection) 12/07/2011   Past Surgical History  Procedure Laterality Date  . Abdominal surgery     History reviewed. No pertinent family history. History  Substance Use Topics  . Smoking status: Never Smoker   . Smokeless tobacco: Not on file  . Alcohol Use: No   OB History   Grav Para Term Preterm Abortions TAB SAB Ect Mult Living                 Review of Systems  Constitutional: Negative for fever, activity change and appetite change.  HENT: Negative for congestion and rhinorrhea.   Eyes: Negative for visual disturbance.  Respiratory: Negative for cough, chest tightness and shortness of breath.   Cardiovascular: Negative for chest pain.  Gastrointestinal: Negative for nausea, vomiting and abdominal pain.  Genitourinary: Negative for dysuria, hematuria, vaginal bleeding and vaginal discharge.  Musculoskeletal: Negative for arthralgias and  myalgias.  Skin: Negative for rash.  Neurological: Positive for weakness. Negative for dizziness and headaches.  A complete 10 system review of systems was obtained and all systems are negative except as noted in the HPI and PMH.      Allergies  Review of patient's allergies indicates no known allergies.  Home Medications   No current outpatient prescriptions on file. BP 104/63  Pulse 95  Temp(Src) 98.6 F (37 C) (Rectal)  Resp 22  SpO2 99% Physical Exam  Constitutional: She appears well-developed and well-nourished. No distress.  Disheveled.  HENT:  Head: Normocephalic and atraumatic.  Mouth/Throat: Oropharynx is clear and moist. No oropharyngeal exudate.  Poor dentition  Eyes: Conjunctivae and EOM are normal. Pupils are equal, round, and reactive to light.  Neck: Normal range of motion. Neck supple.  Cardiovascular: Normal rate, regular rhythm and normal heart sounds.   Pulmonary/Chest: Effort normal and breath sounds normal. No respiratory distress.  Abdominal: Soft. There is no tenderness. There is no rebound and no guarding.  Musculoskeletal: Normal range of motion. She exhibits no edema and no tenderness.  Neurological: She is alert. No cranial nerve deficit. She exhibits normal muscle tone. Coordination normal.  Disoriented to time.  Moving all extremities. No focal deficits.  Skin: Skin is warm.    ED Course  Procedures (including critical care time) Labs Review Labs Reviewed  CBC WITH DIFFERENTIAL - Abnormal; Notable for the following:    RBC 3.86 (*)    Hemoglobin 11.5 (*)  HCT 35.6 (*)    Neutrophils Relative % 80 (*)    Lymphocytes Relative 8 (*)    Lymphs Abs 0.6 (*)    All other components within normal limits  COMPREHENSIVE METABOLIC PANEL - Abnormal; Notable for the following:    Glucose, Bld 125 (*)    BUN 24 (*)    Albumin 2.8 (*)    Alkaline Phosphatase 119 (*)    GFR calc non Af Amer 55 (*)    GFR calc Af Amer 63 (*)    All other  components within normal limits  URINALYSIS, ROUTINE W REFLEX MICROSCOPIC - Abnormal; Notable for the following:    Nitrite POSITIVE (*)    All other components within normal limits  URINE MICROSCOPIC-ADD ON - Abnormal; Notable for the following:    Bacteria, UA MANY (*)    All other components within normal limits  URINE CULTURE  TROPONIN I  PROTIME-INR  CBC  COMPREHENSIVE METABOLIC PANEL   Imaging Review Dg Chest 1 View  10/04/2013   CLINICAL DATA:  Weakness.  Fell from bed today.  EXAM: CHEST - 1 VIEW  COMPARISON:  10/06/2012.  FINDINGS: The heart remains normal in size. The lungs are clear. No fracture or pneumothorax seen. Diffuse osteopenia. Atheromatous arterial calcifications.  IMPRESSION: No acute abnormality.   Electronically Signed   By: Gordan Payment M.D.   On: 10/04/2013 19:52   Ct Head Wo Contrast  10/04/2013   CLINICAL DATA:  Altered mental status  EXAM: CT HEAD WITHOUT CONTRAST  TECHNIQUE: Contiguous axial images were obtained from the base of the skull through the vertex without intravenous contrast. Study was obtained within 24 hr of patient's arrival at the emergency department.  COMPARISON:  October 06, 2012  FINDINGS: There is moderate diffuse atrophy. There is no appreciable mass, hemorrhage, extra-axial fluid collection, or midline shift. There is evidence of a prior infarct in the medial left occipital lobe with adjacent ex vacuo dilatation of the atrium of the left lateral ventricle. There is patchy small vessel disease in the centra semiovale bilaterally. Small lacunar infarcts in both thalamic regions are stable. Small lacunar infarcts in both lentiform nuclei are also present. There is small vessel disease throughout the anterior and mid portions of the right external capsule. There are no new gray-white compartment lesions.  The bony calvarium appears intact. There is opacification of mastoids bilaterally. There is extensive left frontal sinus disease. There is also a  benign osteoma in the lung anterior left ethmoid air cell complex. There is mucosal thickening in both maxillary antra.  IMPRESSION: Atrophy with small vessel disease in stable prior infarcts. No intracranial mass, hemorrhage, or acute appearing infarct. 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   Electronically Signed   By: Bretta Bang M.D.   On: 10/04/2013 19:52     EKG Interpretation   Date/Time:  Saturday October 04 2013 19:15:43 EDT Ventricular Rate:  91 PR Interval:  130 QRS Duration: 74 QT Interval:  352 QTC Calculation: 432 R Axis:   27 Text Interpretation:  Normal sinus rhythm Normal ECG When compared with  ECG of 07-Oct-2012 04:24, Non-specific change in ST segment in Inferior  leads Non-specific change in ST segment in Lateral leads Confirmed by  Manus Gunning  MD, Coleman Kalas [phone]) on 10/04/2013 7:47:40 PM      MDM   Final diagnoses:  Urinary tract infection  Dementia   Dementia patient from home with disheveled appearance and reportedly poor living conditions.  No distress, nontoxic.  UA infected appearing. Labs other wise unremarkable. CT head stable.  Will treat for UTI, social work will need to be notified to evaluate home situation.    Glynn OctaveStephen Euan Wandler, MD 10/04/13 [id number]

## 2013-10-04 NOTE — H&P (Signed)
PCP:   Lanette Hampshire, MD   Chief Complaint:  [place] from the bed  HPI: 73 year old female who   has a past medical history of Hyperlipemia; Hypothyroid; Dementia (12/07/2011); and UTI (lower urinary tract infection) (12/07/2011). Today was brought to the ED, after she rolled off the bed this a.m. There is no injury to head injury patient lives with her son who helped pick her up to the kitchen. She has history of dementia and has been incontinent of stool. She denies loss of consciousness, no head injury. No chest pain no shortness of breath no vomiting no fevers. She denies dysuria. In the ED patient was found to have UTI with abnormal urine and was given Rocephin.  Allergies:  No Known Allergies    Past Medical History  Diagnosis Date  . Hyperlipemia   . Hypothyroid   . Dementia 12/07/2011  . UTI (lower urinary tract infection) 12/07/2011    Past Surgical History  Procedure Laterality Date  . Abdominal surgery      Prior to Admission medications   Medication Sig Start Date End Date Taking? Authorizing Provider  donepezil (ARICEPT) 5 MG tablet Take 5 mg by mouth at bedtime.    Yes Historical Provider, MD  irbesartan (AVAPRO) 75 MG tablet Take 1 tablet (75 mg total) by mouth daily. 10/09/12  Yes Angus Ailene Ravel, MD  levETIRAcetam (KEPPRA) 500 MG tablet Take 1 tablet (500 mg total) by mouth 2 (two) times daily. 10/09/12  Yes Angus Ailene Ravel, MD  levothyroxine (SYNTHROID, LEVOTHROID) 100 MCG tablet Take 1 tablet (100 mcg total) by mouth daily before breakfast. 10/09/12  Yes Angus Ailene Ravel, MD  sertraline (ZOLOFT) 50 MG tablet Take 50 mg by mouth daily.    Yes Historical Provider, MD  simvastatin (ZOCOR) 40 MG tablet Take 40 mg by mouth every evening.   Yes Historical Provider, MD    Social History:  reports that she has never smoked. She does not have any smokeless tobacco history on file. She reports that she does not drink alcohol or use illicit drugs.  No family history on  file.   All the positives are listed in BOLD  Review of Systems:  HEENT: Headache, blurred vision, runny nose, sore throat Neck: Hypothyroidism, hyperthyroidism,,lymphadenopathy Chest : Shortness of breath, history of COPD, Asthma Heart : Chest pain, history of coronary arterey disease GI:  Nausea, vomiting, diarrhea, constipation, GERD GU: Dysuria, urgency, frequency of urination, hematuria Neuro: Stroke, seizures, syncope Psych: Depression, anxiety, hallucinations   Physical Exam: Blood pressure 104/63, pulse 95, temperature 98.6 F (37 C), temperature source Rectal, resp. rate 22, SpO2 99.00%. Constitutional:   Patient is a well-developed and well-nourished *female in no acute distress and cooperative with exam. Head: Normocephalic and atraumatic Mouth: Mucus membranes moist Eyes: PERRL, EOMI, conjunctivae normal Neck: Supple, No Thyromegaly Cardiovascular: RRR, S1 normal, S2 normal Pulmonary/Chest: CTAB, no wheezes, rales, or rhonchi Abdominal: Soft. Non-tender, non-distended, bowel sounds are normal, no masses, organomegaly, or guarding present.  Neurological: A&O x3, Strenght is normal and symmetric bilaterally, cranial nerve II-XII are grossly intact, no focal motor deficit, sensory intact to light touch bilaterally.  Extremities : No Cyanosis, Clubbing or Edema   Labs on Admission:  Results for orders placed during the hospital encounter of 10/04/13 (from the past 48 hour(s))  CBC WITH DIFFERENTIAL     Status: Abnormal   Collection Time    10/04/13  7:02 PM      Result Value Ref Range   WBC 7.6  4.0 - 10.5 K/uL   RBC 3.86 (*) 3.87 - 5.11 MIL/uL   Hemoglobin 11.5 (*) 12.0 - 15.0 g/dL   HCT 35.6 (*) 36.0 - 46.0 %   MCV 92.2  78.0 - 100.0 fL   MCH 29.8  26.0 - 34.0 pg   MCHC 32.3  30.0 - 36.0 g/dL   RDW 14.7  11.5 - 15.5 %   Platelets 252  150 - 400 K/uL   Neutrophils Relative % 80 (*) 43 - 77 %   Neutro Abs 6.1  1.7 - 7.7 K/uL   Lymphocytes Relative 8 (*) 12 - 46  %   Lymphs Abs 0.6 (*) 0.7 - 4.0 K/uL   Monocytes Relative 10  3 - 12 %   Monocytes Absolute 0.8  0.1 - 1.0 K/uL   Eosinophils Relative 2  0 - 5 %   Eosinophils Absolute 0.1  0.0 - 0.7 K/uL   Basophils Relative 0  0 - 1 %   Basophils Absolute 0.0  0.0 - 0.1 K/uL  COMPREHENSIVE METABOLIC PANEL     Status: Abnormal   Collection Time    10/04/13  7:02 PM      Result Value Ref Range   Sodium 137  137 - 147 mEq/L   Potassium 4.3  3.7 - 5.3 mEq/L   Chloride 101  96 - 112 mEq/L   CO2 25  19 - 32 mEq/L   Glucose, Bld 125 (*) 70 - 99 mg/dL   BUN 24 (*) 6 - 23 mg/dL   Creatinine, Ser 1.00  0.50 - 1.10 mg/dL   Calcium 9.5  8.4 - 10.5 mg/dL   Total Protein 7.7  6.0 - 8.3 g/dL   Albumin 2.8 (*) 3.5 - 5.2 g/dL   AST 21  0 - 37 U/L   ALT 19  0 - 35 U/L   Alkaline Phosphatase 119 (*) 39 - 117 U/L   Total Bilirubin 0.3  0.3 - 1.2 mg/dL   GFR calc non Af Amer 55 (*) >90 mL/min   GFR calc Af Amer 63 (*) >90 mL/min   Comment: (NOTE)     The eGFR has been calculated using the CKD EPI equation.     This calculation has not been validated in all clinical situations.     eGFR's persistently <90 mL/min signify possible Chronic Kidney     Disease.  TROPONIN I     Status: None   Collection Time    10/04/13  7:02 PM      Result Value Ref Range   Troponin I <0.30  <0.30 ng/mL   Comment:            Due to the release kinetics of cTnI,     a negative result within the first hours     of the onset of symptoms does not rule out     myocardial infarction with certainty.     If myocardial infarction is still suspected,     repeat the test at appropriate intervals.  PROTIME-INR     Status: None   Collection Time    10/04/13  7:02 PM      Result Value Ref Range   Prothrombin Time 12.0  11.6 - 15.2 seconds   INR 0.90  0.00 - 1.49  URINALYSIS, ROUTINE W REFLEX MICROSCOPIC     Status: Abnormal   Collection Time    10/04/13  7:24 PM      Result Value Ref Range   Color,  Urine YELLOW  YELLOW    APPearance CLEAR  CLEAR   Specific Gravity, Urine 1.020  1.005 - 1.030   pH 5.5  5.0 - 8.0   Glucose, UA NEGATIVE  NEGATIVE mg/dL   Hgb urine dipstick NEGATIVE  NEGATIVE   Bilirubin Urine NEGATIVE  NEGATIVE   Ketones, ur NEGATIVE  NEGATIVE mg/dL   Protein, ur NEGATIVE  NEGATIVE mg/dL   Urobilinogen, UA 0.2  0.0 - 1.0 mg/dL   Nitrite POSITIVE (*) NEGATIVE   Leukocytes, UA NEGATIVE  NEGATIVE  URINE MICROSCOPIC-ADD ON     Status: Abnormal   Collection Time    10/04/13  7:24 PM      Result Value Ref Range   WBC, UA 0-2  <3 WBC/hpf   Bacteria, UA MANY (*) RARE    Radiological Exams on Admission: Dg Chest 1 View  10/04/2013   CLINICAL DATA:  Weakness.  Fell from bed today.  EXAM: CHEST - 1 VIEW  COMPARISON:  10/06/2012.  FINDINGS: The heart remains normal in size. The lungs are clear. No fracture or pneumothorax seen. Diffuse osteopenia. Atheromatous arterial calcifications.  IMPRESSION: No acute abnormality.   Electronically Signed   By: Enrique Sack M.D.   On: 10/04/2013 19:52   Ct Head Wo Contrast  10/04/2013   CLINICAL DATA:  Altered mental status  EXAM: CT HEAD WITHOUT CONTRAST  TECHNIQUE: Contiguous axial images were obtained from the base of the skull through the vertex without intravenous contrast. Study was obtained within 24 hr of patient's arrival at the emergency department.  COMPARISON:  October 06, 2012  FINDINGS: There is moderate diffuse atrophy. There is no appreciable mass, hemorrhage, extra-axial fluid collection, or midline shift. There is evidence of a prior infarct in the medial left occipital lobe with adjacent ex vacuo dilatation of the atrium of the left lateral ventricle. There is patchy small vessel disease in the centra semiovale bilaterally. Small lacunar infarcts in both thalamic regions are stable. Small lacunar infarcts in both lentiform nuclei are also present. There is small vessel disease throughout the anterior and mid portions of the right external capsule. There  are no new gray-white compartment lesions.  The bony calvarium appears intact. There is opacification of mastoids bilaterally. There is extensive left frontal sinus disease. There is also a benign osteoma in the lung anterior left ethmoid air cell complex. There is mucosal thickening in both maxillary antra.  IMPRESSION: Atrophy with small vessel disease in stable prior infarcts. No intracranial mass, hemorrhage, or acute appearing infarct. 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   Electronically Signed   By: Lowella Grip M.D.   On: 10/04/2013 19:52    Assessment/Plan Active Problems:   UTI (lower urinary tract infection)  Dementia Hypothyroidism  UTI Will continue patient on Rocephin 1 g IV 24 hours Follow the urine culture results.  Hypothyroidism Continue Synthroid  Dementia Stable   Code status: Presumed full code     Time Spent on Admission: 23 minutes  [facility] Pager: [phone] 10/04/2013, 10:13 PM  If 7PM-7AM, please contact night-coverage  [email or web address]  Password [email or web address]

## 2013-10-05 LAB — CBC
HCT: 34.8 % — ABNORMAL LOW (ref 36.0–46.0)
Hemoglobin: 11.1 g/dL — ABNORMAL LOW (ref 12.0–15.0)
MCH: 29.7 pg (ref 26.0–34.0)
MCHC: 31.9 g/dL (ref 30.0–36.0)
MCV: 93 fL (ref 78.0–100.0)
PLATELETS: 230 10*3/uL (ref 150–400)
RBC: 3.74 MIL/uL — ABNORMAL LOW (ref 3.87–5.11)
RDW: 14.8 % (ref 11.5–15.5)
WBC: 6.4 10*3/uL (ref 4.0–10.5)

## 2013-10-05 LAB — COMPREHENSIVE METABOLIC PANEL
ALBUMIN: 2.6 g/dL — AB (ref 3.5–5.2)
ALT: 24 U/L (ref 0–35)
AST: 32 U/L (ref 0–37)
Alkaline Phosphatase: 116 U/L (ref 39–117)
BUN: 22 mg/dL (ref 6–23)
CO2: 26 mEq/L (ref 19–32)
Calcium: 9 mg/dL (ref 8.4–10.5)
Chloride: 102 mEq/L (ref 96–112)
Creatinine, Ser: 0.86 mg/dL (ref 0.50–1.10)
GFR calc Af Amer: 76 mL/min — ABNORMAL LOW (ref >90)
GFR calc non Af Amer: 65 mL/min — ABNORMAL LOW (ref >90)
Glucose, Bld: 95 mg/dL (ref 70–99)
Potassium: 4.5 mEq/L (ref 3.7–5.3)
SODIUM: 138 meq/L (ref 137–147)
TOTAL PROTEIN: 7.1 g/dL (ref 6.0–8.3)
Total Bilirubin: 0.4 mg/dL (ref 0.3–1.2)

## 2013-10-05 NOTE — Progress Notes (Signed)
Subjective: The patient is fairly alert. She apparently rolled off the bed was brought into ED and found to have lower urinary tract infection she started on IV Rocephin.  Objective: Vital signs in last 24 hours: Temp:  [97.3 F (36.3 C)-98.6 F (37 C)] 97.3 F (36.3 C) (03/15 0427) Pulse Rate:  [81-101] 81 (03/15 0427) Resp:  [20-22] 22 (03/15 0427) BP: (104-133)/(63-108) 121/66 mmHg (03/15 0427) SpO2:  [96 %-99 %] 99 % (03/15 0427) Weight change:  Last BM Date: 10/04/13  Intake/Output from previous day:   Intake/Output this shift:    Physical Exam: HEENT negative  Neck supple no JVD or thyroid abnormalities  Heart regular rhythm no murmurs  Lungs clear to P&A  Abdomen no palpable organs or masses  Extremities free of edema   Recent Labs  10/04/13 1902 10/05/13 0612  WBC 7.6 6.4  HGB 11.5* 11.1*  HCT 35.6* 34.8*  PLT 252 230   BMET  Recent Labs  10/04/13 1902  NA 137  K 4.3  CL 101  CO2 25  GLUCOSE 125*  BUN 24*  CREATININE 1.00  CALCIUM 9.5    Studies/Results: Dg Chest 1 View  10/04/2013   CLINICAL DATA:  Weakness.  Fell from bed today.  EXAM: CHEST - 1 VIEW  COMPARISON:  10/06/2012.  FINDINGS: The heart remains normal in size. The lungs are clear. No fracture or pneumothorax seen. Diffuse osteopenia. Atheromatous arterial calcifications.  IMPRESSION: No acute abnormality.   Electronically Signed   By: Gordan PaymentSteve  Reid M.D.   On: 10/04/2013 19:52   Ct Head Wo Contrast  10/04/2013   CLINICAL DATA:  Altered mental status  EXAM: CT HEAD WITHOUT CONTRAST  TECHNIQUE: Contiguous axial images were obtained from the base of the skull through the vertex without intravenous contrast. Study was obtained within 24 hr of patient's arrival at the emergency department.  COMPARISON:  October 06, 2012  FINDINGS: There is moderate diffuse atrophy. There is no appreciable mass, hemorrhage, extra-axial fluid collection, or midline shift. There is evidence of a prior infarct in  the medial left occipital lobe with adjacent ex vacuo dilatation of the atrium of the left lateral ventricle. There is patchy small vessel disease in the centra semiovale bilaterally. Small lacunar infarcts in both thalamic regions are stable. Small lacunar infarcts in both lentiform nuclei are also present. There is small vessel disease throughout the anterior and mid portions of the right external capsule. There are no new gray-white compartment lesions.  The bony calvarium appears intact. There is opacification of mastoids bilaterally. There is extensive left frontal sinus disease. There is also a benign osteoma in the lung anterior left ethmoid air cell complex. There is mucosal thickening in both maxillary antra.  IMPRESSION: Atrophy with small vessel disease in stable prior infarcts. No intracranial mass, hemorrhage, or acute appearing infarct. 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   Electronically Signed   By: Bretta BangWilliam  Woodruff M.D.   On: 10/04/2013 19:52    Medications:  . sodium chloride   Intravenous STAT  . cefTRIAXone (ROCEPHIN)  IV  1 g Intravenous Q24H  . donepezil  5 mg Oral QHS  . enoxaparin (LOVENOX) injection  40 mg Subcutaneous Q24H  . irbesartan  75 mg Oral Daily  . levETIRAcetam  500 mg Oral BID  . levothyroxine  100 mcg Oral QAC breakfast  . sertraline  50 mg Oral Daily  . simvastatin  40 mg Oral QPM    . sodium  chloride 75 mL/hr at 10/05/13 0981     Assessment/Plan: 1 dementia  2 urinary tract infection  3 hypothyroidism  Plan to continue current IV Rocephin and fluids await results of culture     LOS: 1 day   Braycen Burandt G 10/05/2013, 7:14 AM

## 2013-10-06 MED ORDER — ENSURE COMPLETE PO LIQD
237.0000 mL | Freq: Two times a day (BID) | ORAL | Status: DC
  Administered 2013-10-06 – 2013-10-07 (×4): 237 mL via ORAL
  Administered 2013-10-08 (×2): via ORAL
  Administered 2013-10-09 (×2): 237 mL via ORAL
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
## 2013-10-06 NOTE — Progress Notes (Signed)
Utilization Review Complete  

## 2013-10-06 NOTE — Evaluation (Signed)
Physical Therapy Evaluation Patient Details Name: [name] MRN: [id number] DOB: 12/20/1940 Today's Date: 10/06/2013 Time: [id number] PT Time Calculation (min): 29 min  PT Assessment / Plan / Recommendation History of Present Illness  Pt is admitted with a UTI.  She lives with her son and reportedly rolled out of bed at home.  Her son had to pick her up off of the floor.  She has a hx of cerebellar stroke, seizure disorder,  dementia and is totally incontinent.    Clinical Impression  Pt was seen for evaluation.  She was awake, pleasant and cooperative.  She was able to answer questions, although I do not know if she is responding correctly.  She stated that she walks with a walker at home but also states that she is in bed most all of the time at home due to a fear of falling.  Her strength is WNL as is bed mobility.  She has limited use of the LUE due to old stroke.  Her primary mobility limitation is that of  poor balance and this prevents her to functionally ambulate.  She required max assist to try to take 2 steps with a walker.  On chart review, she was seen by me back in 2013 and was about at this level of mobility.  She went to SNF at that time but it is doubtful that she got much out of it.   Today, pt did not want to sit up any longer than it took for her to eat lunch.  She is happy to stay in bed.  Her rehab potential appears to be poor.  She states that she does not have a w/c but if not, she will need one.  She would be appropriate for long term nursing home care if she does  not return home.                 Barriers to Discharge  unkempt home setting, clutter      Equipment Recommendations    w/c            Precautions / Restrictions Precautions Precautions: Fall Restrictions Weight Bearing Restrictions: No         Mobility  Bed Mobility Overal bed mobility: Modified Independent Transfers Overall transfer level: Needs assistance Equipment used: None Transfers:  Sit to/from Stand;Stand Pivot Transfers Sit to Stand: Supervision Stand pivot transfers: Supervision General transfer comment: pt was a bit tremulous in weight bearing but had no LOB             Visit Information  Last PT Received On: 10/06/13 History of Present Illness: Pt is admitted with a UTI.  She lives with her son and reportedly rolled out of bed at home.  Her son had to pick her up off of the floor.  She has a hx of cerebellar stroke, dementia and is totally incontinent.         Prior Functioning  Prior Function Level of Independence: Needs assistance Gait / Transfers Assistance Needed: ambulates with a walker, transfers independently ADL's / Homemaking Assistance Needed: assist with bathing and dressing, self feeds    Cognition  Cognition Arousal/Alertness: Awake/alert Behavior During Therapy: WFL for tasks assessed/performed Overall Cognitive Status: History of cognitive impairments - at baseline    Extremity/Trunk Assessment Lower Extremity Assessment Lower Extremity Assessment: Overall WFL for tasks assessed   Balance Balance Overall balance assessment: Needs assistance Sitting-balance support: No upper extremity supported;Feet supported Sitting balance-Leahy Scale: Good  Standing  balance with bilateral hand support is poor  End of Session PT - End of Session Equipment Utilized During Treatment: Gait belt Activity Tolerance: Patient tolerated treatment well Patient left: in chair;with call bell/phone within reach;with chair alarm set Nurse Communication: Mobility status  GP     Konrad Penta 10/06/2013, 12:27 PM

## 2013-10-06 NOTE — Progress Notes (Signed)
INITIAL NUTRITION ASSESSMENT  DOCUMENTATION CODES Per approved criteria  -Non-severe (moderate) malnutrition in the context of social or environmental circumstances   Pt meets criteria for mioderate MALNUTRITION in the context of social or environmental circumstances as evidenced by moderate fat and muscle depletion.  INTERVENTION: Ensure Complete po BID, each supplement provides 350 kcal and 13 grams of protein  NUTRITION DIAGNOSIS: Inadequate oral intake related to decreased appetite as evidenced by moderate to severe muscle depletion.   Goal: Pt will meet >90% of estimated nutritional needs  Monitor:  PO intake, labs, skin assessments, weight changes, I/O's  Reason for Assessment: MST=2  73 y.o. female  Admitting Dx: <principal problem not specified>  ASSESSMENT: Pt admitted with UTI, currently on IV rocephin. She lives at home with her son; noted social work consult for poor living conditions. Pt reports good appetite. Noted PO: 75%. She also reports good appetite PTA, reporting "I always eat good". She denies any weight loss. She reports UBW of 85-90# ("I've always been small; the only time I gained weight was when I carried my children"). Wt has been stable x 1 year, per wt hx.  Pt with poor dentition, but denies any chewing or swallowing difficulties. She is agreeable to drinking a supplement; she reports that she does not currently drink, but has in the past.  Nutrition Focused Physical Exam:  Subcutaneous Fat:  Orbital Region: WDL Upper Arm Region: moderate depletion Thoracic and Lumbar Region: moderate depletion  Muscle:  Temple Region: WDL Clavicle Bone Region: moderate depletion Clavicle and Acromion Bone Region: moderate depletion Scapular Bone Region: moderate dpeletion Dorsal Hand: moderate depletion Patellar Region: severe depletion Anterior Thigh Region: severe depletion Posterior Calf Region: moderate depletion  Edema: none present  Height: Ht  Readings from Last 1 Encounters:  10/06/13 4\' 10"  (1.473 m)    Weight: Wt Readings from Last 1 Encounters:  10/06/13 85 lb (38.556 kg)    Ideal Body Weight: 97#  % Ideal Body Weight: 87%  Wt Readings from Last 10 Encounters:  10/06/13 85 lb (38.556 kg)  10/08/12 85 lb 15.7 oz (39 kg)  10/06/12 85 lb (38.556 kg)  05/10/12 93 lb 0.6 oz (42.2 kg)  04/09/12 89 lb 11.6 oz (40.7 kg)  12/10/11 90 lb 6.4 oz (41.005 kg)    Usual Body Weight: 90#  % Usual Body Weight: 94%  BMI:  Body mass index is 17.77 kg/(m^2). Meets criteria for underweight.   Estimated Nutritional Needs: Kcal: 1000-1200 daily Protein: 39-48 grams daily Fluid: 1.0-1.2 L daily  Skin: No issues noted  Diet Order: General  EDUCATION NEEDS: -Education needs addressed   Intake/Output Summary (Last 24 hours) at 10/06/13 1128 Last data filed at 10/06/13 1051  Gross per 24 hour  Intake    720 ml  Output      0 ml  Net    720 ml    Last BM: 10/04/13  Labs:   Recent Labs Lab 10/04/13 1902 10/05/13 0612  NA 137 138  K 4.3 4.5  CL 101 102  CO2 25 26  BUN 24* 22  CREATININE 1.00 0.86  CALCIUM 9.5 9.0  GLUCOSE 125* 95    CBG (last 3)  No results found for this basename: GLUCAP,  in the last 72 hours  Scheduled Meds: . cefTRIAXone (ROCEPHIN)  IV  1 g Intravenous Q24H  . donepezil  5 mg Oral QHS  . enoxaparin (LOVENOX) injection  40 mg Subcutaneous Q24H  . irbesartan  75 mg Oral Daily  .  levETIRAcetam  500 mg Oral BID  . levothyroxine  100 mcg Oral QAC breakfast  . sertraline  50 mg Oral Daily  . simvastatin  40 mg Oral QPM    Continuous Infusions: . sodium chloride 75 mL/hr at 10/05/13 0940    Past Medical History  Diagnosis Date  . Hyperlipemia   . Hypothyroid   . Dementia 12/07/2011  . UTI (lower urinary tract infection) 12/07/2011    Past Surgical History  Procedure Laterality Date  . Abdominal surgery      Teagyn Fishel A. Mayford KnifeWilliams, RD, LDN Pager: [phone]

## 2013-10-06 NOTE — Care Management Note (Addendum)
    Page 1 of 1   10/07/2013     3:44:34 PM   CARE MANAGEMENT NOTE 10/07/2013  Patient:  [name],[name]   Account Number:  [id number]  Date Initiated:  10/06/2013  Documentation initiated by:  Rosemary HolmsOBSON,Bertel Venard  Subjective/Objective Assessment:   Pt lives at home with son. Per EMS, home situations is not acceptable and an  aps reports was made. Per PT, pt is unable to walk and dementia. APS, CSW and CM discussing disposition     Action/Plan:   Anticipated DC Date:     Anticipated DC Plan:  ASSISTED LIVING / REST HOME  In-house referral  Clinical Social Worker      DC Planning Services  CM consult      Choice offered to / List presented to:             Status of service:  In process, will continue to follow Medicare Important Message given?   (If response is "NO", the following Medicare IM given date fields will be blank) Date Medicare IM given:   Date Additional Medicare IM given:    Discharge Disposition:    Per UR Regulation:    If discussed at Long Length of Stay Meetings, dates discussed:    Comments:  10/07/13 Rosemary HolmsAmy Clearance Chenault RN BSN CM CM spoke to pt. [facility] and A[facility]  10/06/13 Rosemary HolmsAmy Isadora Delorey RN BSN CM

## 2013-10-06 NOTE — Progress Notes (Signed)
Subjective: The patient is fairly alert but has dementia. She does have a lower urinary tract infection treated with IV Rocephin. Her chemistries remained normal  Objective: Vital signs in last 24 hours: Temp:  [97.6 F (36.4 C)-98.5 F (36.9 C)] 97.6 F (36.4 C) (03/16 1610) Pulse Rate:  [80-100] 100 (03/16 0613) Resp:  [20-22] 20 (03/16 0613) BP: (140-149)/(65-91) 140/65 mmHg (03/16 0613) SpO2:  [93 %-97 %] 93 % (03/16 9604) Weight change:  Last BM Date: 10/04/13  Intake/Output from previous day: 03/15 0701 - 03/16 0700 In: 720 [P.O.:720] Out: -  Intake/Output this shift:    Physical Exam: The patient appears alert  HEENT negative  Neck supple no JVD or thyroid abnormalities  Heart regular rhythm no murmurs  Lungs clear to P&A  Abdomen no palpable organs or masses  Extremities free of edema   Recent Labs  10/04/13 1902 10/05/13 0612  WBC 7.6 6.4  HGB 11.5* 11.1*  HCT 35.6* 34.8*  PLT 252 230   BMET  Recent Labs  10/04/13 1902 10/05/13 0612  NA 137 138  K 4.3 4.5  CL 101 102  CO2 25 26  GLUCOSE 125* 95  BUN 24* 22  CREATININE 1.00 0.86  CALCIUM 9.5 9.0    Studies/Results: Dg Chest 1 View  10/04/2013   CLINICAL DATA:  Weakness.  Fell from bed today.  EXAM: CHEST - 1 VIEW  COMPARISON:  10/06/2012.  FINDINGS: The heart remains normal in size. The lungs are clear. No fracture or pneumothorax seen. Diffuse osteopenia. Atheromatous arterial calcifications.  IMPRESSION: No acute abnormality.   Electronically Signed   By: Gordan Payment M.D.   On: 10/04/2013 19:52   Ct Head Wo Contrast  10/04/2013   CLINICAL DATA:  Altered mental status  EXAM: CT HEAD WITHOUT CONTRAST  TECHNIQUE: Contiguous axial images were obtained from the base of the skull through the vertex without intravenous contrast. Study was obtained within 24 hr of patient's arrival at the emergency department.  COMPARISON:  October 06, 2012  FINDINGS: There is moderate diffuse atrophy. There is no  appreciable mass, hemorrhage, extra-axial fluid collection, or midline shift. There is evidence of a prior infarct in the medial left occipital lobe with adjacent ex vacuo dilatation of the atrium of the left lateral ventricle. There is patchy small vessel disease in the centra semiovale bilaterally. Small lacunar infarcts in both thalamic regions are stable. Small lacunar infarcts in both lentiform nuclei are also present. There is small vessel disease throughout the anterior and mid portions of the right external capsule. There are no new gray-white compartment lesions.  The bony calvarium appears intact. There is opacification of mastoids bilaterally. There is extensive left frontal sinus disease. There is also a benign osteoma in the lung anterior left ethmoid air cell complex. There is mucosal thickening in both maxillary antra.  IMPRESSION: Atrophy with small vessel disease in stable prior infarcts. No intracranial mass, hemorrhage, or acute appearing infarct. 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   Electronically Signed   By: Bretta Bang M.D.   On: 10/04/2013 19:52    Medications:  . cefTRIAXone (ROCEPHIN)  IV  1 g Intravenous Q24H  . donepezil  5 mg Oral QHS  . enoxaparin (LOVENOX) injection  40 mg Subcutaneous Q24H  . irbesartan  75 mg Oral Daily  . levETIRAcetam  500 mg Oral BID  . levothyroxine  100 mcg Oral QAC breakfast  . sertraline  50 mg Oral Daily  .  simvastatin  40 mg Oral QPM    . sodium chloride 75 mL/hr at 10/05/13 0940     Assessment/Plan: 1. Dementia  2. Urinary tract infection organism not yet detected continue IV Rocephin as antibiotic  3 hypothyroidism continue levothyroxine for 100 mcg daily   LOS: 2 days   Melvinia Ashby G 10/06/2013, 6:16 AM

## 2013-10-07 MED ORDER — ENOXAPARIN SODIUM 30 MG/0.3ML ~~LOC~~ SOLN
30.0000 mg | SUBCUTANEOUS | Status: DC
  Administered 2013-10-08 – 2013-10-09 (×2): 30 mg via SUBCUTANEOUS
  Filled 2013-10-07 (×2): qty 0.3

## 2013-10-07 NOTE — Clinical Social Work Placement (Signed)
    Clinical Social Work Department CLINICAL SOCIAL WORK PLACEMENT NOTE 10/07/2013  Patient:  [name],[name]  Account Number:  [id number] Admit date:  10/04/2013  Clinical Social Worker:  Santa GeneraANNE CUNNINGHAM, CLINICAL SOCIAL WORKER  Date/time:  10/07/2013 11:00 AM  Clinical Social Work is seeking post-discharge placement for this patient at the following level of care:   SKILLED NURSING   (*CSW will update this form in Epic as items are completed)   10/07/2013  Patient/family provided with [facility] Department of Clinical Social Work's list of facilities offering this level of care within the geographic area requested by the patient (or if unable, by the patient's family).  10/07/2013  Patient/family informed of their freedom to choose among providers that offer the needed level of care, that participate in Medicare, Medicaid or managed care program needed by the patient, have an available bed and are willing to accept the patient.  10/07/2013  Patient/family informed of [facility]' ownership interest in [facility], as well as of the fact that they are under no obligation to receive care at this facility.  PASARR submitted to EDS on 10/06/2013 PASARR number received from [employer] on 10/06/2013  FL2 transmitted to all facilities in geographic area requested by pt/family on  10/07/2013 FL2 transmitted to all facilities within larger geographic area on   Patient informed that his/her managed care company has contracts with or will negotiate with  certain facilities, including the following:     Patient/family informed of bed offers received:   Patient chooses bed at  Physician recommends and patient chooses bed at    Patient to be transferred to  on   Patient to be transferred to facility by   The following physician request were entered in Epic:   Additional Comments: Patient had preexisting PASARR  Santa GeneraAnne Cunningham, LCSW Clinical Social Worker [phone])

## 2013-10-07 NOTE — Progress Notes (Signed)
Subjective: The patient is fairly alert but has dementia she is being treated for lower urinary tract infection with IV Rocephin  Objective: Vital signs in last 24 hours: Temp:  [97.3 F (36.3 C)-98.1 F (36.7 C)] 98.1 F (36.7 C) (03/17 0451) Pulse Rate:  [84-93] 84 (03/17 0451) Resp:  [20] 20 (03/17 0451) BP: (129-134)/(71-86) 134/86 mmHg (03/17 0451) SpO2:  [95 %-98 %] 98 % (03/17 0451) Weight:  [38.556 kg (85 lb)] 38.556 kg (85 lb) (03/16 0900) Weight change:  Last BM Date: 10/05/13  Intake/Output from previous day: 03/16 0701 - 03/17 0700 In: 720 [P.O.:720] Out: 2 [Urine:1; Stool:1] Intake/Output this shift:    Physical Exam: Patient appears alert HEENT negative  Neck supple no JVD or thyroid thyroid abnormalities  Heart regular rhythm no murmurs  Lungs clear to P&A  Abdomen no palpable organs or masses  Extremities free of edema   Recent Labs  10/04/13 1902 10/05/13 0612  WBC 7.6 6.4  HGB 11.5* 11.1*  HCT 35.6* 34.8*  PLT 252 230   BMET  Recent Labs  10/04/13 1902 10/05/13 0612  NA 137 138  K 4.3 4.5  CL 101 102  CO2 25 26  GLUCOSE 125* 95  BUN 24* 22  CREATININE 1.00 0.86  CALCIUM 9.5 9.0    Studies/Results: No results found.  Medications:  . cefTRIAXone (ROCEPHIN)  IV  1 g Intravenous Q24H  . donepezil  5 mg Oral QHS  . enoxaparin (LOVENOX) injection  40 mg Subcutaneous Q24H  . feeding supplement (ENSURE COMPLETE)  237 mL Oral BID BM  . irbesartan  75 mg Oral Daily  . levETIRAcetam  500 mg Oral BID  . levothyroxine  100 mcg Oral QAC breakfast  . sertraline  50 mg Oral Daily  . simvastatin  40 mg Oral QPM    . sodium chloride 75 mL/hr at 10/07/13 0120     Assessment/Plan: 1. Dementia  2. Urinary tract infection organism not yet known plan to continue IV Rocephin as antibiotic  3. Hypothyroidism to continue levothyroxin 100 mcg daily   LOS: 3 days   Jaylee Lantry G 10/07/2013, 6:35 AM

## 2013-10-07 NOTE — Clinical Social Work Note (Signed)
CSW spoke with Shawna OrleansMelanie at DSS who reports she plans to file for guardianship in AM. Discussed bed offers with Shawna OrleansMelanie and she will inform CSW when decision is made. CSW to continue to follow.   Derenda FennelKara Cherylanne Ardelean, [place] [phone]

## 2013-10-08 NOTE — Clinical Social Work Note (Signed)
Received call from Toy BakerMelanie Hankins at DSS. Hearing tomorrow morning for interim guardianship. Paperwork served to pt by Kerr[employer]McG[employer]iff this afternoon.  Derenda FennelKara Sharian Delia, [place] [phone]

## 2013-10-08 NOTE — Progress Notes (Signed)
Subjective: The patient met remains fairly alert but has dementia and is receiving IV Rocephin for UTI  Objective: Vital signs in last 24 hours: Temp:  [97.5 F (36.4 C)-97.9 F (36.6 C)] 97.5 F (36.4 C) (03/18 0446) Pulse Rate:  [73-110] 73 (03/18 0446) Resp:  [20] 20 (03/18 0446) BP: (119-137)/(75-91) 131/80 mmHg (03/18 0446) SpO2:  [96 %-100 %] 97 % (03/18 0446) Weight change:  Last BM Date: 10/07/13  Intake/Output from previous day: 03/17 0701 - 03/18 0700 In: 1953.8 [P.O.:480; I.V.:1423.8; IV Piggyback:50] Out: -  Intake/Output this shift: Total I/O In: 573.8 [I.V.:523.8; IV Piggyback:50] Out: -   Physical Exam: The patient appears alert  HEENT negative  Neck supple no JVD or thyroid mildness Heart regular rhythm no murmurs  Lungs clear to P&A  Abdomen no palpable organs or masses  Extremities free of edema No results found for this basename: WBC, HGB, HCT, PLT,  in the last 72 hours BMET No results found for this basename: NA, K, CL, CO2, GLUCOSE, BUN, CREATININE, CALCIUM,  in the last 72 hours  Studies/Results: No results found.  Medications:  . cefTRIAXone (ROCEPHIN)  IV  1 g Intravenous Q24H  . donepezil  5 mg Oral QHS  . enoxaparin (LOVENOX) injection  30 mg Subcutaneous Q24H  . feeding supplement (ENSURE COMPLETE)  237 mL Oral BID BM  . irbesartan  75 mg Oral Daily  . levETIRAcetam  500 mg Oral BID  . levothyroxine  100 mcg Oral QAC breakfast  . sertraline  50 mg Oral Daily  . simvastatin  40 mg Oral QPM    . sodium chloride 75 mL/hr at 10/08/13 0439     Assessment/Plan: 1. Dementia  Urinary tract infection organism not yet known plan to continue IV Rocephin as antibiotic  Hypothyroidism to continue levothyroxin 100 mcg daily   LOS: 4 days   Adaisha Campise G 10/08/2013, 6:32 AM

## 2013-10-09 NOTE — Clinical Social Work Note (Addendum)
D/C completed by MD. Pt to [facility] [place]. Facility and DSS notified. Melanie at DSS plans to notify family this afternoon of guardianship. D/C summary faxed.  Derenda FennelKara [place] Doster, [place]CS[place] [phone]

## 2013-10-09 NOTE — Progress Notes (Signed)
10/09/13 1324 Patient being [facility] [place]. Called report to HarmonyBarbara, receiving nurse at facility. IV site d/c'd per nursing student, within normal limits. D/c summary to be faxed to facility per SW. Discharge packet to be sent with patient. Pt in stable condition awaiting EMS transport. Facility aware of awaiting EMS. Earnstine RegalAshley Jontae Sonier, RN

## 2013-10-09 NOTE — Discharge Summary (Signed)
Physician Discharge Summary  [name] E Luckenbaugh UJW:[id number] DOB: 1940-09-24 DOA: 10/04/2013  PCP: Alice ReichertMCINNIS,Wassim Kirksey G, MD  Admit date: 10/04/2013 Discharge date: 10/09/2013   Follow-up Information   Follow up with Alice ReichertMCINNIS,Kerah Hardebeck G, MD. Schedule an appointment as soon as possible for a visit in 1 week.   Specialty:  Family Medicine   Contact information:   [address] [place] [id number] [phone]       Discharge Diagnoses:  1. Urinary tract infection secondary to Klebsiella pneumoniae 2. Dementia Alzheimer type 3. Hypothyroidism 4. Hypertension essential 5. Seizure disorder 6. Dyslipidemia 7.   Discharge Condition: Improved good condition Disposition patient discharged [facility]   Diet recommendation: Regular  Filed Weights   10/06/13 0900  Weight: 38.556 kg (85 lb)    History of present illness:  The patient was brought to the emergency department with history of having rolled off off her bed. She was examined and found to have urinary tract infection. She was started on IV Rocephin after urine was collected for culture and sensitivity. She was admitted to MedSurg bed. She does have a prior history of dementia , hypothyroidism, dyslipidemia and prior seizure disorder   Hospital Course:  The patient initially was placed on intravenous fluids and intravenous Rocephin for treatment of her urinary tract infection. She remained alert and oriented during her hospital stay CT of her head showed evidence of cortical atrophy and stable prior infarcts. Urine culture Klebsiella pneumoniae sensitive to Cipro. The patient had minimal urinary symptoms during the latter part of the hospital stay and repeat urine was negative. Social services had her placed in protective custody and obtained a bed at [facility] in [place]. The patient will be transported there by EMS she is to continue the following medications   Discharge Instructions Patient is to continue the following  medications. [facility] [place]    Medication List         donepezil 5 MG tablet  Commonly known as:  ARICEPT  Take 5 mg by mouth at bedtime.     irbesartan 75 MG tablet  Commonly known as:  AVAPRO  Take 1 tablet (75 mg total) by mouth daily.     levETIRAcetam 500 MG tablet  Commonly known as:  KEPPRA  Take 1 tablet (500 mg total) by mouth 2 (two) times daily.     levothyroxine 100 MCG tablet  Commonly known as:  SYNTHROID, LEVOTHROID  Take 1 tablet (100 mcg total) by mouth daily before breakfast.     sertraline 50 MG tablet  Commonly known as:  ZOLOFT  Take 50 mg by mouth daily.     simvastatin 40 MG tablet  Commonly known as:  ZOCOR  Take 40 mg by mouth every evening.       No Known Allergies  The results of significant diagnostics from this hospitalization (including imaging, microbiology, ancillary and laboratory) are listed below for reference.    Significant Diagnostic Studies: Dg Chest 1 View  10/04/2013   CLINICAL DATA:  Weakness.  Fell from bed today.  EXAM: CHEST - 1 VIEW  COMPARISON:  10/06/2012.  FINDINGS: The heart remains normal in size. The lungs are clear. No fracture or pneumothorax seen. Diffuse osteopenia. Atheromatous arterial calcifications.  IMPRESSION: No acute abnormality.   Electronically Signed   By: Gordan PaymentSteve  Reid M.D.   On: 10/04/2013 19:52   Ct Head Wo Contrast  10/04/2013   CLINICAL DATA:  Altered mental  status  EXAM: CT HEAD WITHOUT CONTRAST  TECHNIQUE: Contiguous axial images were obtained from the base of the skull through the vertex without intravenous contrast. Study was obtained within 24 hr of patient's arrival at the emergency department.  COMPARISON:  October 06, 2012  FINDINGS: There is moderate diffuse atrophy. There is no appreciable mass, hemorrhage, extra-axial fluid collection, or midline shift. There is evidence of a prior infarct in the medial left occipital lobe with adjacent ex vacuo  dilatation of the atrium of the left lateral ventricle. There is patchy small vessel disease in the centra semiovale bilaterally. Small lacunar infarcts in both thalamic regions are stable. Small lacunar infarcts in both lentiform nuclei are also present. There is small vessel disease throughout the anterior and mid portions of the right external capsule. There are no new gray-white compartment lesions.  The bony calvarium appears intact. There is opacification of mastoids bilaterally. There is extensive left frontal sinus disease. There is also a benign osteoma in the lung anterior left ethmoid air cell complex. There is mucosal thickening in both maxillary antra.  IMPRESSION: Atrophy with small vessel disease in stable prior infarcts. No intracranial mass, hemorrhage, or acute appearing infarct. 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   Electronically Signed   By: Bretta Bang M.D.   On: 10/04/2013 19:52    Microbiology: No results found for this or any previous visit (from the past 240 hour(s)).   Labs: Basic Metabolic Panel:  Recent Labs Lab 10/04/13 1902 10/05/13 0612  NA 137 138  K 4.3 4.5  CL 101 102  CO2 25 26  GLUCOSE 125* 95  BUN 24* 22  CREATININE 1.00 0.86  CALCIUM 9.5 9.0   Liver Function Tests:  Recent Labs Lab 10/04/13 1902 10/05/13 0612  AST 21 32  ALT 19 24  ALKPHOS 119* 116  BILITOT 0.3 0.4  PROT 7.7 7.1  ALBUMIN 2.8* 2.6*   No results found for this basename: LIPASE, AMYLASE,  in the last 168 hours No results found for this basename: AMMONIA,  in the last 168 hours CBC:  Recent Labs Lab 10/04/13 1902 10/05/13 0612  WBC 7.6 6.4  NEUTROABS 6.1  --   HGB 11.5* 11.1*  HCT 35.6* 34.8*  MCV 92.2 93.0  PLT 252 230   Cardiac Enzymes:  Recent Labs Lab 10/04/13 1902  TROPONINI <0.30   BNP: BNP (last 3 results) No results found for this basename: PROBNP,  in the last 8760 hours CBG: No results found for this  basename: GLUCAP,  in the last 168 hours  Active Problems:   UTI (lower urinary tract infection)   Urinary tract infection   Time spent 45 minutes  Signed:  Butch Penny, MD 10/09/2013, 12:51 PM   gmd

## 2013-10-09 NOTE — Clinical Social Work Placement (Signed)
Clinical Social Work Department CLINICAL SOCIAL WORK PLACEMENT NOTE 10/09/2013  Patient:  [name],[name]  Account Number:  [id number] Admit date:  10/04/2013  Clinical Social Worker:  Santa GeneraANNE CUNNINGHAM, CLINICAL SOCIAL WORKER  Date/time:  10/07/2013 11:00 AM  Clinical Social Work is seeking post-discharge placement for this patient at the following level of care:   SKILLED NURSING   (*CSW will update this form in Epic as items are completed)   10/07/2013  Patient/family provided with [facility] Department of Clinical Social Work's list of facilities offering this level of care within the geographic area requested by the patient (or if unable, by the patient's family).  10/07/2013  Patient/family informed of their freedom to choose among providers that offer the needed level of care, that participate in Medicare, Medicaid or managed care program needed by the patient, have an available bed and are willing to accept the patient.  10/07/2013  Patient/family informed of [facility]' ownership interest in [facility], as well as of the fact that they are under no obligation to receive care at this facility.  PASARR submitted to [employer] on 10/06/2013 PASARR number received from [employer] on 10/06/2013  FL2 transmitted to all facilities in geographic area requested by pt/family on  10/07/2013 FL2 transmitted to all facilities within larger geographic area on   Patient informed that his/her managed care company has contracts with or will negotiate with  certain facilities, including the following:     Patient/family informed of bed offers received:  10/09/2013 [facility] Physician recommends and patient chooses bed at  [facility]  Patient to be [facility] on  10/09/2013 Patient [facility] be [facility] to [facility]  The following physician request were entered in Epic:   Additional Comments: Patient had  preexisting PASARR  Derenda FennelKara Allysha Tryon, LCSW [phone]

## 2013-10-09 NOTE — Progress Notes (Signed)
10/09/13 1527 Patient discharged [facility] [place], discharge packet sent with patient. Pt left floor in stable condition via stretcher/EMS transport this afternoon. Earnstine RegalAshley Lavonna Lampron, RN

## 2013-10-09 NOTE — Clinical Social Work Note (Signed)
Per Shawna OrleansMelanie at [employer], judge has granted guardians[place] of [place] to [place] [facility].  DSS wants patient to discharge to [facility] [place] [facility].  MD informed, will come to dc patient in late afternoon.  Per RN, patient will need EMS transport to SNF.  Facility([facility]) informed, would like patient to arrive by late afternoon in order for arrangements to be made for coordination of care.    Santa GeneraAnne Cunningham, LCSW Clinical Social Worker [phone])

## 2016-01-22 DEATH — deceased

## 2016-02-14 IMAGING — CT CT HEAD W/O CM
1 series · 15 of 30 positions shown, 19 images · non-contrast
Comparison: October 06, 2012

CLINICAL DATA: Altered mental status

EXAM:
CT HEAD WITHOUT CONTRAST
TECHNIQUE: Contiguous axial images were obtained from the base of the skull
through the vertex without intravenous contrast. Study was obtained
within 24 hr of patient's arrival at the emergency department.

[Series 2: headtrauma 4.8 h37s · axial · 0.43mm/px · z∈[+65,+220]mm · 15 of 36 slices shown, 19 images]
[im 2/36  brain]
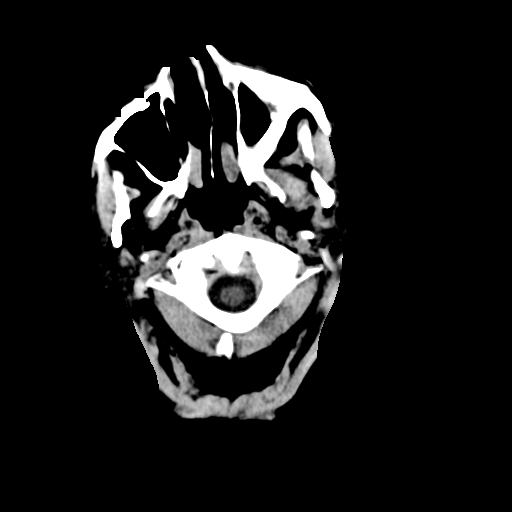
[im 2/36  bone]
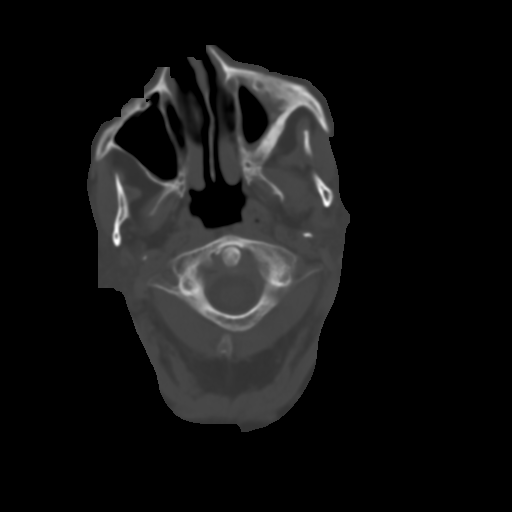
[im 4/36  brain]
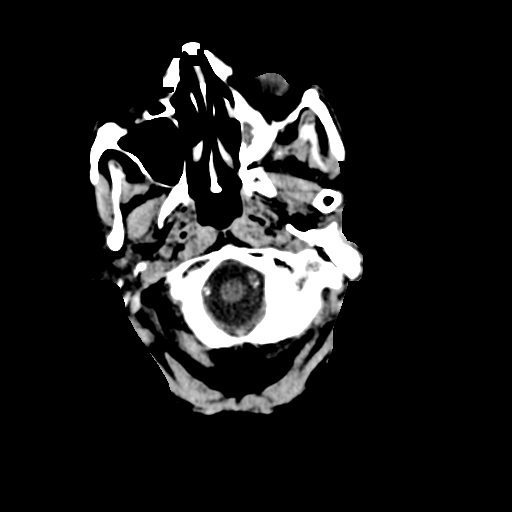
[im 7/36  brain]
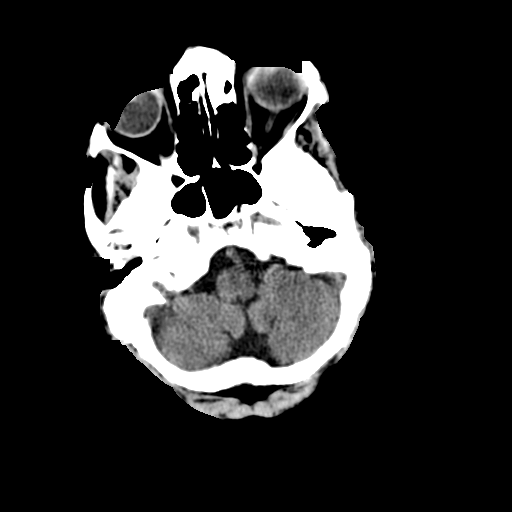
[im 9/36  brain]
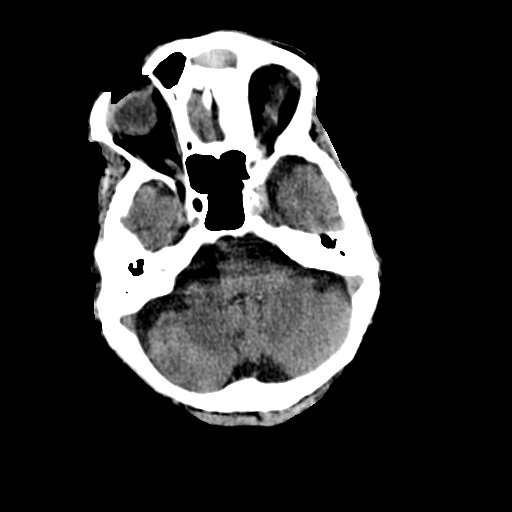
[im 11/36  brain]
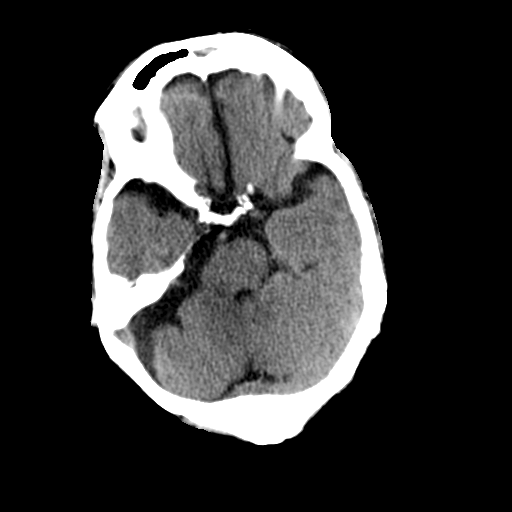
[im 11/36  bone]
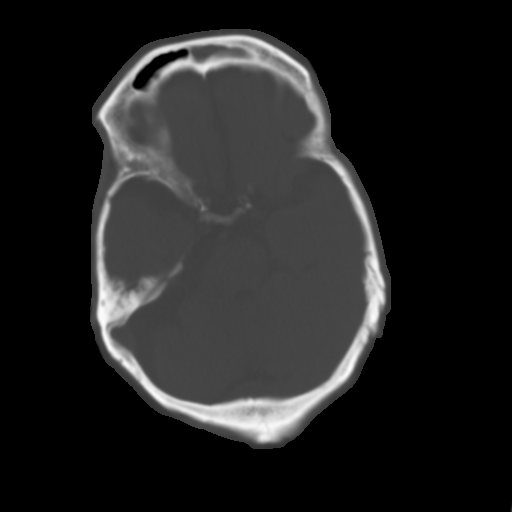
[im 14/36  brain]
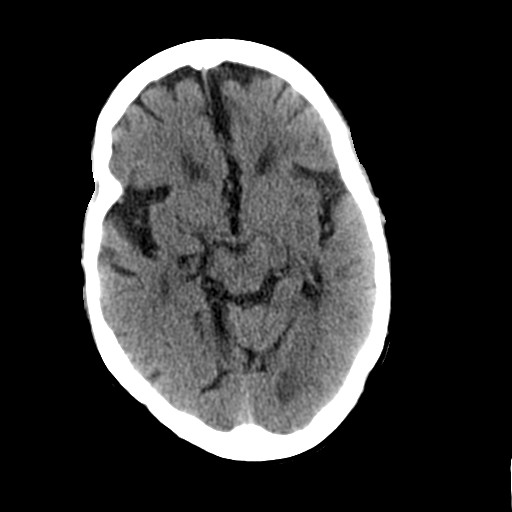
[im 16/36  brain]
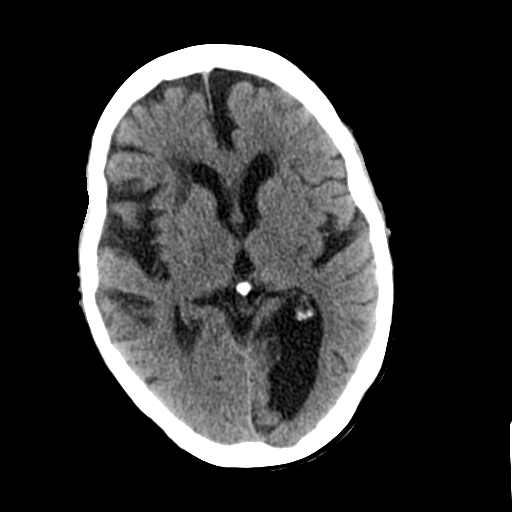
[im 19/36  brain]
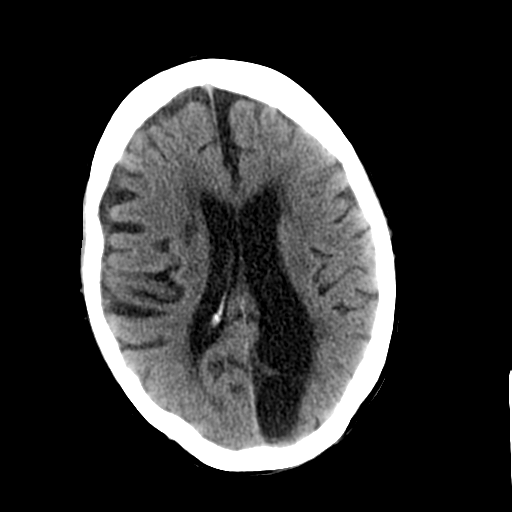
[im 20/36  brain]
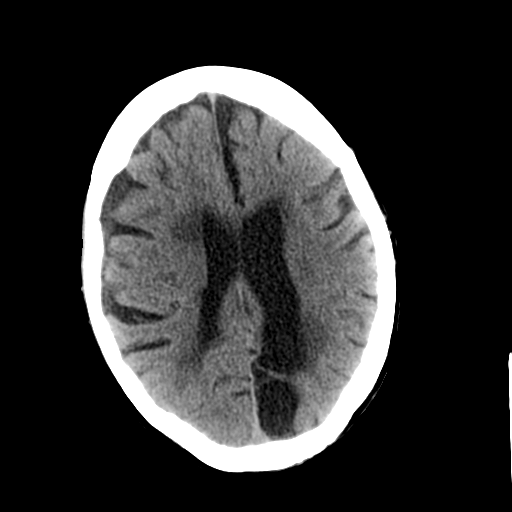
[im 20/36  bone]
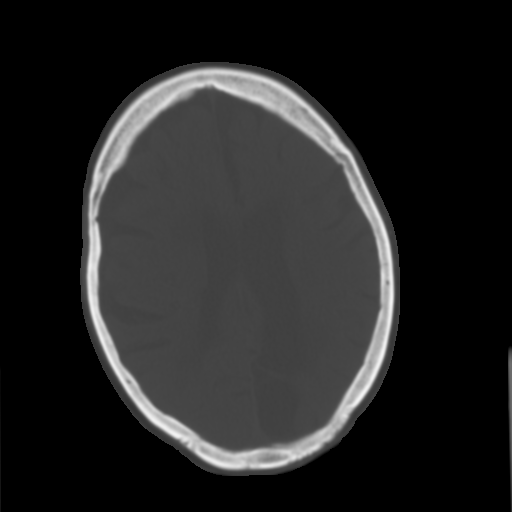
[im 22/36  brain]
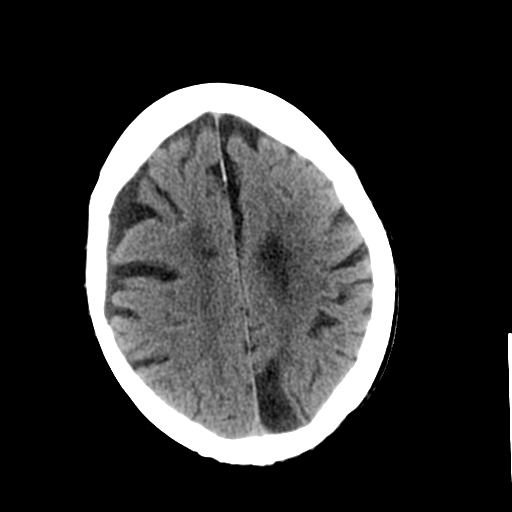
[im 25/36  brain]
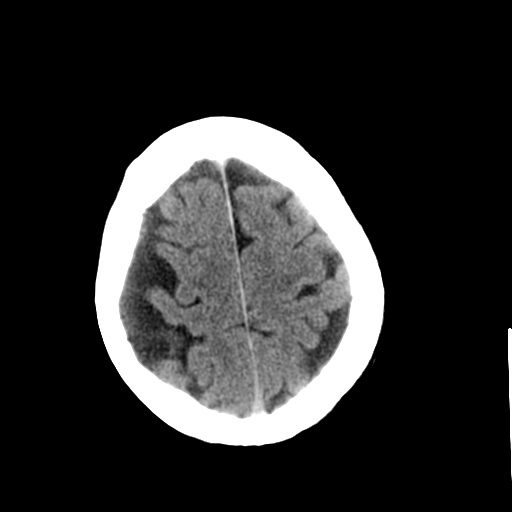
[im 27/36  brain]
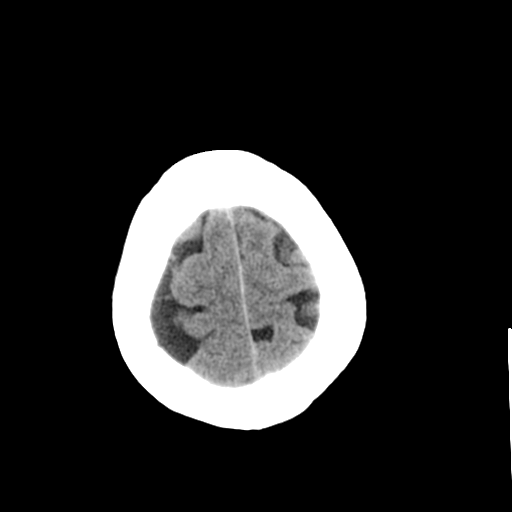
[im 29/36  brain]
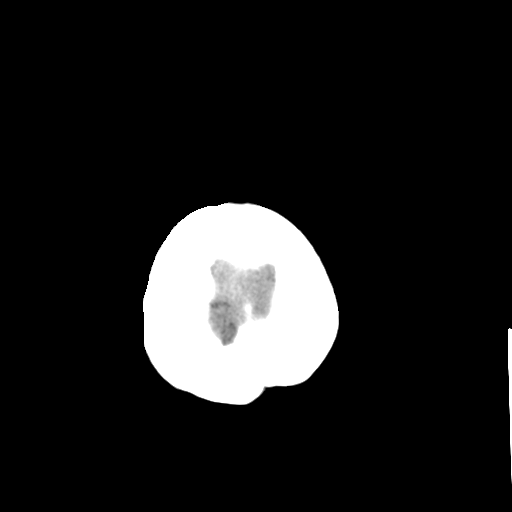
[im 29/36  bone]
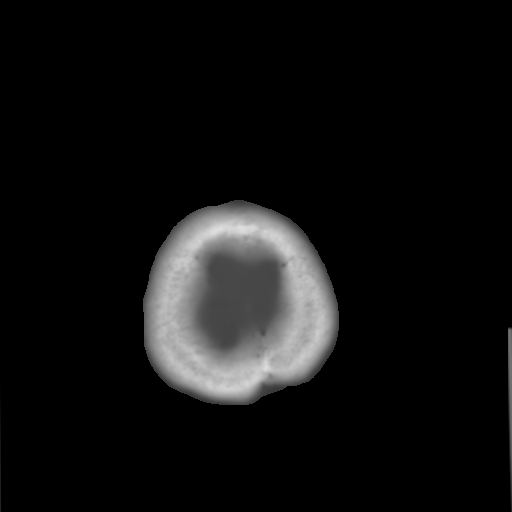
[im 32/36  brain]
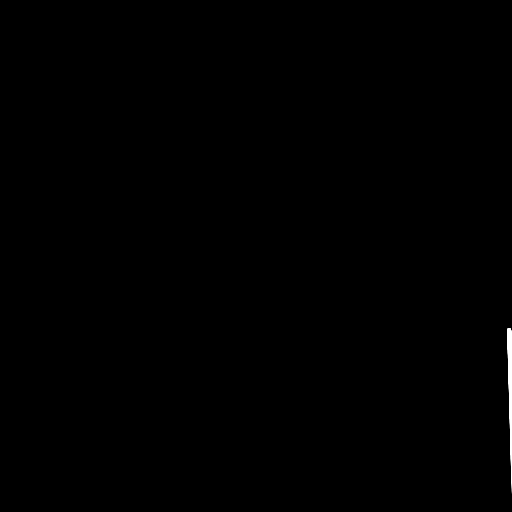
[im 34/36  brain]
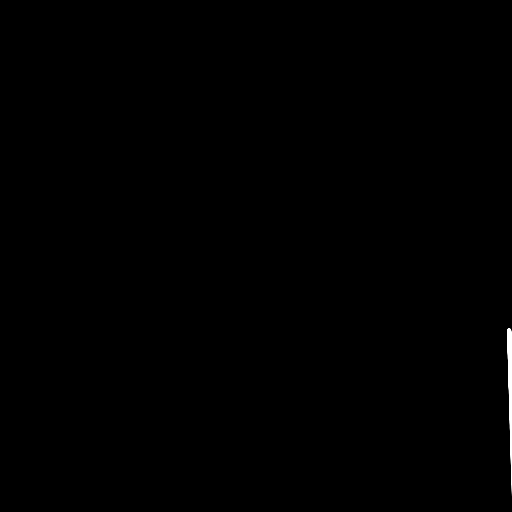

[15 of 30 positions shown; findings below may reference images not displayed]

FINDINGS: There is moderate diffuse atrophy. There is no appreciable mass,
hemorrhage, extra-axial fluid collection, or midline shift. There is
evidence of a prior infarct in the medial left occipital lobe with
adjacent ex vacuo dilatation of the atrium of the left lateral
ventricle. There is patchy small vessel disease in the centra
semiovale bilaterally. Small lacunar infarcts in both thalamic
regions are stable. Small lacunar infarcts in both lentiform nuclei
are also present. There is small vessel disease throughout the
anterior and mid portions of the right external capsule. There are
no new gray-white compartment lesions.

The bony calvarium appears intact. There is opacification of
mastoids bilaterally. There is extensive left frontal sinus disease.
There is also a benign osteoma in the lung anterior left ethmoid air
cell complex. There is mucosal thickening in both maxillary antra.
IMPRESSION: Atrophy with small vessel disease in stable prior infarcts. No
intracranial mass, hemorrhage, or acute appearing infarct. Bilateral
mastoid disease as well as multifocal paranasal sinus disease.
